# Patient Record
Sex: Male | Born: 1945 | Race: White | Hispanic: No | Marital: Married
Health system: Southern US, Community
[De-identification: ages and names within clinical notes are randomized; demographics above are authoritative.]

---

## 2003-08-01 ENCOUNTER — Other Ambulatory Visit: Payer: Self-pay

## 2003-12-23 ENCOUNTER — Other Ambulatory Visit: Payer: Self-pay

## 2004-01-29 ENCOUNTER — Other Ambulatory Visit: Payer: Self-pay

## 2004-03-27 ENCOUNTER — Other Ambulatory Visit: Payer: Self-pay

## 2004-03-28 ENCOUNTER — Observation Stay: Payer: Self-pay | Admitting: Internal Medicine

## 2004-04-09 ENCOUNTER — Ambulatory Visit: Payer: Self-pay | Admitting: Gastroenterology

## 2004-04-12 ENCOUNTER — Ambulatory Visit: Payer: Self-pay | Admitting: Unknown Physician Specialty

## 2004-06-02 ENCOUNTER — Other Ambulatory Visit: Payer: Self-pay

## 2004-06-02 ENCOUNTER — Emergency Department: Payer: Self-pay | Admitting: Internal Medicine

## 2004-08-25 ENCOUNTER — Other Ambulatory Visit: Payer: Self-pay

## 2004-08-25 ENCOUNTER — Inpatient Hospital Stay: Payer: Self-pay | Admitting: Anesthesiology

## 2004-08-30 ENCOUNTER — Emergency Department: Payer: Self-pay | Admitting: General Practice

## 2009-05-24 ENCOUNTER — Emergency Department: Payer: Self-pay | Admitting: Unknown Physician Specialty

## 2010-01-02 ENCOUNTER — Emergency Department: Payer: Self-pay | Admitting: Emergency Medicine

## 2010-07-30 ENCOUNTER — Emergency Department: Payer: Self-pay | Admitting: Emergency Medicine

## 2011-04-21 ENCOUNTER — Ambulatory Visit: Payer: Self-pay | Admitting: Oncology

## 2011-04-24 ENCOUNTER — Ambulatory Visit: Payer: Self-pay | Admitting: Oncology

## 2011-05-24 ENCOUNTER — Ambulatory Visit: Payer: Self-pay | Admitting: Oncology

## 2011-06-24 ENCOUNTER — Ambulatory Visit: Payer: Self-pay | Admitting: Oncology

## 2011-06-25 ENCOUNTER — Ambulatory Visit: Payer: Self-pay | Admitting: Oncology

## 2011-06-25 LAB — CBC CANCER CENTER
Basophil #: 0.3 x10 3/mm — ABNORMAL HIGH (ref 0.0–0.1)
Basophil %: 2.8 %
Eosinophil #: 0 x10 3/mm (ref 0.0–0.7)
MCH: 33.3 pg (ref 26.0–34.0)
MCHC: 34.5 g/dL (ref 32.0–36.0)
Monocyte #: 1.1 x10 3/mm — ABNORMAL HIGH (ref 0.0–0.7)
Neutrophil %: 65.6 %
RDW: 25.9 % — ABNORMAL HIGH (ref 11.5–14.5)

## 2011-07-10 LAB — COMPREHENSIVE METABOLIC PANEL
Albumin: 2.4 g/dL — ABNORMAL LOW (ref 3.4–5.0)
Anion Gap: 10 (ref 7–16)
BUN: 8 mg/dL (ref 7–18)
Bilirubin,Total: 0.2 mg/dL (ref 0.2–1.0)
Co2: 33 mmol/L — ABNORMAL HIGH (ref 21–32)
EGFR (African American): >60
Glucose: 120 mg/dL — ABNORMAL HIGH (ref 65–99)
Potassium: 2.5 mmol/L — CL (ref 3.5–5.1)
SGOT(AST): 20 U/L (ref 15–37)
Total Protein: 6.5 g/dL (ref 6.4–8.2)

## 2011-07-10 LAB — CBC CANCER CENTER
Basophil %: 0.3 %
Eosinophil #: 0 x10 3/mm (ref 0.0–0.7)
HGB: 8.9 g/dL — ABNORMAL LOW (ref 13.0–18.0)
Lymphocyte #: 1.8 x10 3/mm (ref 1.0–3.6)
Lymphocyte %: 15.4 %
MCH: 34.2 pg — ABNORMAL HIGH (ref 26.0–34.0)
MCHC: 34.1 g/dL (ref 32.0–36.0)
MCV: 100 fL (ref 80–100)
Neutrophil %: 76.8 %
Platelet: 308 x10 3/mm (ref 150–440)
RDW: 28.4 % — ABNORMAL HIGH (ref 11.5–14.5)

## 2011-07-17 LAB — CBC CANCER CENTER
Basophil %: 0.6 %
Eosinophil #: 0.1 x10 3/mm (ref 0.0–0.7)
HCT: 26.3 % — ABNORMAL LOW (ref 40.0–52.0)
Lymphocyte #: 1.2 x10 3/mm (ref 1.0–3.6)
Lymphocyte %: 19 %
MCH: 35.1 pg — ABNORMAL HIGH (ref 26.0–34.0)
MCHC: 34.2 g/dL (ref 32.0–36.0)
MCV: 103 fL — ABNORMAL HIGH (ref 80–100)
Monocyte %: 10.3 %
Neutrophil #: 4.4 x10 3/mm (ref 1.4–6.5)
Neutrophil %: 69.2 %
Platelet: 253 x10 3/mm (ref 150–440)
RBC: 2.56 10*6/uL — ABNORMAL LOW (ref 4.40–5.90)

## 2011-07-17 LAB — BASIC METABOLIC PANEL
Anion Gap: 6 — ABNORMAL LOW (ref 7–16)
BUN: 11 mg/dL (ref 7–18)
Chloride: 94 mmol/L — ABNORMAL LOW (ref 98–107)
Creatinine: 0.77 mg/dL (ref 0.60–1.30)
EGFR (African American): >60
EGFR (Non-African Amer.): >60
Glucose: 96 mg/dL (ref 65–99)
Osmolality: 266 (ref 275–301)
Potassium: 3.1 mmol/L — ABNORMAL LOW (ref 3.5–5.1)

## 2011-07-24 LAB — BASIC METABOLIC PANEL
Anion Gap: 10 (ref 7–16)
Calcium, Total: 7.9 mg/dL — ABNORMAL LOW (ref 8.5–10.1)
Chloride: 97 mmol/L — ABNORMAL LOW (ref 98–107)
Co2: 28 mmol/L (ref 21–32)
Creatinine: 0.8 mg/dL (ref 0.60–1.30)
EGFR (African American): >60
EGFR (Non-African Amer.): >60
Glucose: 82 mg/dL (ref 65–99)
Osmolality: 269 (ref 275–301)

## 2011-07-24 LAB — CBC CANCER CENTER
Basophil #: 0.1 x10 3/mm (ref 0.0–0.1)
Basophil %: 1.2 %
Eosinophil #: 0 x10 3/mm (ref 0.0–0.7)
Eosinophil %: 0.5 %
HCT: 25.9 % — ABNORMAL LOW (ref 40.0–52.0)
HGB: 9 g/dL — ABNORMAL LOW (ref 13.0–18.0)
Lymphocyte #: 0.7 x10 3/mm — ABNORMAL LOW (ref 1.0–3.6)
Lymphocyte %: 16.9 %
MCH: 36.2 pg — ABNORMAL HIGH (ref 26.0–34.0)
MCHC: 34.8 g/dL (ref 32.0–36.0)
MCV: 104 fL — ABNORMAL HIGH (ref 80–100)
Monocyte %: 9.6 %
Neutrophil #: 3.1 x10 3/mm (ref 1.4–6.5)
Neutrophil %: 71.8 %
RBC: 2.49 10*6/uL — ABNORMAL LOW (ref 4.40–5.90)
WBC: 4.3 x10 3/mm (ref 3.8–10.6)

## 2011-07-24 LAB — HEPATIC FUNCTION PANEL A (ARMC)
SGOT(AST): 18 U/L (ref 15–37)
SGPT (ALT): 13 U/L
Total Protein: 6.8 g/dL (ref 6.4–8.2)

## 2011-07-25 ENCOUNTER — Ambulatory Visit: Payer: Self-pay | Admitting: Oncology

## 2011-08-07 LAB — BASIC METABOLIC PANEL
BUN: 8 mg/dL (ref 7–18)
Chloride: 95 mmol/L — ABNORMAL LOW (ref 98–107)
Creatinine: 0.85 mg/dL (ref 0.60–1.30)
EGFR (Non-African Amer.): >60
Glucose: 102 mg/dL — ABNORMAL HIGH (ref 65–99)
Osmolality: 265 (ref 275–301)
Potassium: 3.8 mmol/L (ref 3.5–5.1)
Sodium: 133 mmol/L — ABNORMAL LOW (ref 136–145)

## 2011-08-07 LAB — CBC CANCER CENTER
Basophil #: 0 x10 3/mm (ref 0.0–0.1)
Eosinophil #: 0 x10 3/mm (ref 0.0–0.7)
Eosinophil %: 1 %
Lymphocyte #: 0.4 x10 3/mm — ABNORMAL LOW (ref 1.0–3.6)
MCH: 36.7 pg — ABNORMAL HIGH (ref 26.0–34.0)
MCHC: 34.5 g/dL (ref 32.0–36.0)
MCV: 106 fL — ABNORMAL HIGH (ref 80–100)
Monocyte #: 0.2 x10 3/mm (ref 0.0–0.7)
Neutrophil %: 76.5 %
Platelet: 150 x10 3/mm (ref 150–440)
RBC: 2.75 10*6/uL — ABNORMAL LOW (ref 4.40–5.90)
RDW: 18.8 % — ABNORMAL HIGH (ref 11.5–14.5)

## 2011-08-07 LAB — HEPATIC FUNCTION PANEL A (ARMC)
Albumin: 3.2 g/dL — ABNORMAL LOW (ref 3.4–5.0)
Bilirubin, Direct: 0.2 mg/dL (ref 0.00–0.20)
SGOT(AST): 21 U/L (ref 15–37)
Total Protein: 7.2 g/dL (ref 6.4–8.2)

## 2011-08-21 LAB — COMPREHENSIVE METABOLIC PANEL
Albumin: 2.8 g/dL — ABNORMAL LOW (ref 3.4–5.0)
Alkaline Phosphatase: 117 U/L (ref 50–136)
Anion Gap: 10 (ref 7–16)
Bilirubin,Total: 0.5 mg/dL (ref 0.2–1.0)
Co2: 28 mmol/L (ref 21–32)
Creatinine: 0.74 mg/dL (ref 0.60–1.30)
Glucose: 97 mg/dL (ref 65–99)
Osmolality: 272 (ref 275–301)
Potassium: 3 mmol/L — ABNORMAL LOW (ref 3.5–5.1)
Sodium: 136 mmol/L (ref 136–145)

## 2011-08-21 LAB — CBC CANCER CENTER
Basophil %: 0.8 %
Eosinophil #: 0 x10 3/mm (ref 0.0–0.7)
Eosinophil %: 1.3 %
HCT: 28.4 % — ABNORMAL LOW (ref 40.0–52.0)
HGB: 9.7 g/dL — ABNORMAL LOW (ref 13.0–18.0)
MCHC: 34.1 g/dL (ref 32.0–36.0)
Monocyte #: 0.4 x10 3/mm (ref 0.0–0.7)
Monocyte %: 15.2 %
Neutrophil %: 61.3 %
RDW: 15.6 % — ABNORMAL HIGH (ref 11.5–14.5)
WBC: 2.7 x10 3/mm — ABNORMAL LOW (ref 3.8–10.6)

## 2011-08-22 ENCOUNTER — Ambulatory Visit: Payer: Self-pay | Admitting: Oncology

## 2011-09-03 LAB — COMPREHENSIVE METABOLIC PANEL
Anion Gap: 6 — ABNORMAL LOW (ref 7–16)
Bilirubin,Total: 0.3 mg/dL (ref 0.2–1.0)
Calcium, Total: 8.5 mg/dL (ref 8.5–10.1)
Chloride: 99 mmol/L (ref 98–107)
Co2: 32 mmol/L (ref 21–32)
EGFR (African American): >60
EGFR (Non-African Amer.): >60
Glucose: 92 mg/dL (ref 65–99)
Osmolality: 275 (ref 275–301)
SGPT (ALT): 14 U/L
Sodium: 137 mmol/L (ref 136–145)

## 2011-09-03 LAB — CBC CANCER CENTER
Eosinophil #: 0.1 x10 3/mm (ref 0.0–0.7)
HGB: 11.1 g/dL — ABNORMAL LOW (ref 13.0–18.0)
MCH: 35.5 pg — ABNORMAL HIGH (ref 26.0–34.0)
MCHC: 33.8 g/dL (ref 32.0–36.0)
Monocyte #: 0.5 x10 3/mm (ref 0.0–0.7)
Neutrophil %: 63.2 %
Platelet: 277 x10 3/mm (ref 150–440)

## 2011-09-10 LAB — CBC CANCER CENTER
Basophil %: 1 %
Eosinophil %: 1.3 %
HCT: 36 % — ABNORMAL LOW (ref 40.0–52.0)
HGB: 12.6 g/dL — ABNORMAL LOW (ref 13.0–18.0)
Lymphocyte #: 1 x10 3/mm (ref 1.0–3.6)
MCH: 36.3 pg — ABNORMAL HIGH (ref 26.0–34.0)
MCV: 104 fL — ABNORMAL HIGH (ref 80–100)
Monocyte #: 0.5 x10 3/mm (ref 0.0–0.7)
Monocyte %: 12.3 %
Neutrophil #: 2.5 x10 3/mm (ref 1.4–6.5)
Platelet: 343 x10 3/mm (ref 150–440)
RBC: 3.46 10*6/uL — ABNORMAL LOW (ref 4.40–5.90)
WBC: 4.1 x10 3/mm (ref 3.8–10.6)

## 2011-09-10 LAB — COMPREHENSIVE METABOLIC PANEL
Albumin: 3.4 g/dL (ref 3.4–5.0)
Alkaline Phosphatase: 114 U/L (ref 50–136)
BUN: 11 mg/dL (ref 7–18)
Bilirubin,Total: 0.3 mg/dL (ref 0.2–1.0)
Calcium, Total: 8.9 mg/dL (ref 8.5–10.1)
Co2: 30 mmol/L (ref 21–32)
Creatinine: 0.71 mg/dL (ref 0.60–1.30)
EGFR (Non-African Amer.): >60
Glucose: 103 mg/dL — ABNORMAL HIGH (ref 65–99)
Osmolality: 272 (ref 275–301)
Potassium: 3.7 mmol/L (ref 3.5–5.1)
SGOT(AST): 21 U/L (ref 15–37)
Sodium: 136 mmol/L (ref 136–145)
Total Protein: 7.4 g/dL (ref 6.4–8.2)

## 2011-09-17 LAB — COMPREHENSIVE METABOLIC PANEL
Alkaline Phosphatase: 106 U/L (ref 50–136)
BUN: 24 mg/dL — ABNORMAL HIGH (ref 7–18)
Chloride: 93 mmol/L — ABNORMAL LOW (ref 98–107)
Co2: 28 mmol/L (ref 21–32)
Creatinine: 0.94 mg/dL (ref 0.60–1.30)
EGFR (Non-African Amer.): >60
Glucose: 98 mg/dL (ref 65–99)
SGOT(AST): 19 U/L (ref 15–37)
SGPT (ALT): 16 U/L
Sodium: 130 mmol/L — ABNORMAL LOW (ref 136–145)

## 2011-09-17 LAB — CBC CANCER CENTER
Basophil #: 0 x10 3/mm (ref 0.0–0.1)
Basophil %: 0.5 %
Eosinophil #: 0 x10 3/mm (ref 0.0–0.7)
Lymphocyte #: 0.7 x10 3/mm — ABNORMAL LOW (ref 1.0–3.6)
Lymphocyte %: 9.2 %
MCV: 103 fL — ABNORMAL HIGH (ref 80–100)
Monocyte #: 0.7 x10 3/mm (ref 0.0–0.7)
Monocyte %: 10.2 %
Neutrophil #: 5.7 x10 3/mm (ref 1.4–6.5)
Neutrophil %: 80 %
RBC: 3.55 10*6/uL — ABNORMAL LOW (ref 4.40–5.90)
WBC: 7.1 x10 3/mm (ref 3.8–10.6)

## 2011-09-22 ENCOUNTER — Ambulatory Visit: Payer: Self-pay | Admitting: Oncology

## 2011-09-24 ENCOUNTER — Inpatient Hospital Stay: Payer: Self-pay | Admitting: Oncology

## 2011-09-24 LAB — BASIC METABOLIC PANEL
Anion Gap: 13 (ref 7–16)
BUN: 29 mg/dL — ABNORMAL HIGH (ref 7–18)
Calcium, Total: 9.1 mg/dL (ref 8.5–10.1)
Co2: 30 mmol/L (ref 21–32)
Creatinine: 0.96 mg/dL (ref 0.60–1.30)
EGFR (African American): >60

## 2011-09-24 LAB — URINALYSIS, COMPLETE
Blood: NEGATIVE
Hyaline Cast: 100
Leukocyte Esterase: NEGATIVE
Nitrite: NEGATIVE
Ph: 5 (ref 4.5–8.0)
Protein: 30
Specific Gravity: 1.029 (ref 1.003–1.030)
Squamous Epithelial: 1

## 2011-09-24 LAB — CBC CANCER CENTER
Basophil #: 0 x10 3/mm (ref 0.0–0.1)
Basophil %: 0.8 %
Eosinophil #: 0 x10 3/mm (ref 0.0–0.7)
Eosinophil %: 0.3 %
HGB: 12.9 g/dL — ABNORMAL LOW (ref 13.0–18.0)
Lymphocyte #: 0.4 x10 3/mm — ABNORMAL LOW (ref 1.0–3.6)
Lymphocyte %: 9.1 %
MCV: 101 fL — ABNORMAL HIGH (ref 80–100)
Monocyte #: 0.4 x10 3/mm (ref 0.0–0.7)
Neutrophil #: 3.5 x10 3/mm (ref 1.4–6.5)
Platelet: 207 x10 3/mm (ref 150–440)
RBC: 3.7 10*6/uL — ABNORMAL LOW (ref 4.40–5.90)
RDW: 15.2 % — ABNORMAL HIGH (ref 11.5–14.5)
WBC: 4.3 x10 3/mm (ref 3.8–10.6)

## 2011-09-25 LAB — CBC WITH DIFFERENTIAL/PLATELET
Eosinophil #: 0 10*3/uL (ref 0.0–0.7)
HCT: 30.8 % — ABNORMAL LOW (ref 40.0–52.0)
HGB: 10.4 g/dL — ABNORMAL LOW (ref 13.0–18.0)
Lymphocyte %: 15.8 %
MCHC: 33.6 g/dL (ref 32.0–36.0)
Monocyte %: 10.6 %
Neutrophil #: 2.2 10*3/uL (ref 1.4–6.5)
Neutrophil %: 73 %

## 2011-09-25 LAB — COMPREHENSIVE METABOLIC PANEL
Albumin: 2.7 g/dL — ABNORMAL LOW (ref 3.4–5.0)
BUN: 20 mg/dL — ABNORMAL HIGH (ref 7–18)
Bilirubin,Total: 0.2 mg/dL (ref 0.2–1.0)
Calcium, Total: 8.3 mg/dL — ABNORMAL LOW (ref 8.5–10.1)
Chloride: 98 mmol/L (ref 98–107)
Co2: 28 mmol/L (ref 21–32)
EGFR (African American): >60
EGFR (Non-African Amer.): >60
Glucose: 87 mg/dL (ref 65–99)
Osmolality: 272 (ref 275–301)
SGOT(AST): 33 U/L (ref 15–37)
Sodium: 135 mmol/L — ABNORMAL LOW (ref 136–145)

## 2011-09-26 LAB — CBC WITH DIFFERENTIAL/PLATELET
Eosinophil #: 0 10*3/uL (ref 0.0–0.7)
Eosinophil %: 0.1 %
HCT: 25.8 % — ABNORMAL LOW (ref 40.0–52.0)
HGB: 8.6 g/dL — ABNORMAL LOW (ref 13.0–18.0)
Lymphocyte #: 0.5 10*3/uL — ABNORMAL LOW (ref 1.0–3.6)
Lymphocyte %: 20.3 %
MCHC: 33.2 g/dL (ref 32.0–36.0)
MCV: 102 fL — ABNORMAL HIGH (ref 80–100)
Monocyte %: 16.1 %
Neutrophil #: 1.5 10*3/uL (ref 1.4–6.5)
Neutrophil %: 63 %
RDW: 14.7 % — ABNORMAL HIGH (ref 11.5–14.5)

## 2011-09-26 LAB — BASIC METABOLIC PANEL
Anion Gap: 12 (ref 7–16)
BUN: 8 mg/dL (ref 7–18)
Calcium, Total: 7.6 mg/dL — ABNORMAL LOW (ref 8.5–10.1)
Chloride: 105 mmol/L (ref 98–107)
EGFR (African American): >60
EGFR (Non-African Amer.): >60
Osmolality: 277 (ref 275–301)
Potassium: 3.1 mmol/L — ABNORMAL LOW (ref 3.5–5.1)
Sodium: 140 mmol/L (ref 136–145)

## 2011-09-26 LAB — URINE CULTURE

## 2011-09-29 ENCOUNTER — Inpatient Hospital Stay: Payer: Self-pay | Admitting: Oncology

## 2011-09-30 LAB — CULTURE, BLOOD (SINGLE)

## 2011-10-02 LAB — BASIC METABOLIC PANEL
Anion Gap: 11 (ref 7–16)
BUN: 7 mg/dL (ref 7–18)
Chloride: 100 mmol/L (ref 98–107)
Creatinine: 0.59 mg/dL — ABNORMAL LOW (ref 0.60–1.30)
EGFR (African American): >60
EGFR (Non-African Amer.): >60
Glucose: 69 mg/dL (ref 65–99)
Osmolality: 268 (ref 275–301)
Sodium: 136 mmol/L (ref 136–145)

## 2011-10-02 LAB — CBC WITH DIFFERENTIAL/PLATELET
Basophil %: 1.5 %
Eosinophil %: 0.4 %
HGB: 10.5 g/dL — ABNORMAL LOW (ref 13.0–18.0)
MCH: 34 pg (ref 26.0–34.0)
MCHC: 33.9 g/dL (ref 32.0–36.0)
Monocyte #: 0.3 x10 3/mm (ref 0.2–1.0)
Monocyte %: 12.1 %
Neutrophil %: 69 %
Platelet: 165 10*3/uL (ref 150–440)
RBC: 3.08 10*6/uL — ABNORMAL LOW (ref 4.40–5.90)
WBC: 2.8 10*3/uL — ABNORMAL LOW (ref 3.8–10.6)

## 2011-10-22 ENCOUNTER — Ambulatory Visit: Payer: Self-pay | Admitting: Oncology

## 2011-11-03 ENCOUNTER — Ambulatory Visit: Payer: Self-pay | Admitting: Oncology

## 2011-11-03 LAB — COMPREHENSIVE METABOLIC PANEL
Albumin: 3.3 g/dL — ABNORMAL LOW (ref 3.4–5.0)
Alkaline Phosphatase: 92 U/L (ref 50–136)
Anion Gap: 10 (ref 7–16)
Bilirubin,Total: 0.5 mg/dL (ref 0.2–1.0)
Creatinine: 0.76 mg/dL (ref 0.60–1.30)
EGFR (Non-African Amer.): >60
Glucose: 114 mg/dL — ABNORMAL HIGH (ref 65–99)
Osmolality: 269 (ref 275–301)
SGOT(AST): 14 U/L — ABNORMAL LOW (ref 15–37)
Sodium: 134 mmol/L — ABNORMAL LOW (ref 136–145)
Total Protein: 6.7 g/dL (ref 6.4–8.2)

## 2011-11-03 LAB — CBC CANCER CENTER
Basophil #: 0 x10 3/mm (ref 0.0–0.1)
Eosinophil #: 0 x10 3/mm (ref 0.0–0.7)
Eosinophil %: 1 %
HCT: 39.1 % — ABNORMAL LOW (ref 40.0–52.0)
Lymphocyte #: 0.6 x10 3/mm — ABNORMAL LOW (ref 1.0–3.6)
Lymphocyte %: 13.5 %
MCH: 32 pg (ref 26.0–34.0)
MCV: 97 fL (ref 80–100)
Monocyte #: 0.5 x10 3/mm (ref 0.2–1.0)
Platelet: 227 x10 3/mm (ref 150–440)
RDW: 14.4 % (ref 11.5–14.5)
WBC: 4.5 x10 3/mm (ref 3.8–10.6)

## 2011-11-22 ENCOUNTER — Ambulatory Visit: Payer: Self-pay | Admitting: Oncology

## 2011-11-24 LAB — CBC CANCER CENTER
Eosinophil #: 0 x10 3/mm (ref 0.0–0.7)
Eosinophil %: 0 %
Lymphocyte #: 0.2 x10 3/mm — ABNORMAL LOW (ref 1.0–3.6)
Lymphocyte %: 2.2 %
MCHC: 32.6 g/dL (ref 32.0–36.0)
MCV: 94 fL (ref 80–100)
Monocyte #: 0.2 x10 3/mm (ref 0.2–1.0)
Monocyte %: 2.8 %
Neutrophil #: 8.5 x10 3/mm — ABNORMAL HIGH (ref 1.4–6.5)
Neutrophil %: 94.8 %
Platelet: 258 x10 3/mm (ref 150–440)
RBC: 3.53 10*6/uL — ABNORMAL LOW (ref 4.40–5.90)
RDW: 14.7 % — ABNORMAL HIGH (ref 11.5–14.5)
WBC: 9 x10 3/mm (ref 3.8–10.6)

## 2011-11-24 LAB — COMPREHENSIVE METABOLIC PANEL
Albumin: 2.5 g/dL — ABNORMAL LOW (ref 3.4–5.0)
Alkaline Phosphatase: 85 U/L (ref 50–136)
Bilirubin,Total: 0.5 mg/dL (ref 0.2–1.0)
Calcium, Total: 8.4 mg/dL — ABNORMAL LOW (ref 8.5–10.1)
Chloride: 99 mmol/L (ref 98–107)
Co2: 30 mmol/L (ref 21–32)
EGFR (African American): >60
EGFR (Non-African Amer.): >60
Glucose: 138 mg/dL — ABNORMAL HIGH (ref 65–99)
Potassium: 3.7 mmol/L (ref 3.5–5.1)
SGPT (ALT): 17 U/L
Sodium: 135 mmol/L — ABNORMAL LOW (ref 136–145)

## 2011-12-22 ENCOUNTER — Ambulatory Visit: Payer: Self-pay | Admitting: Oncology

## 2011-12-31 LAB — COMPREHENSIVE METABOLIC PANEL
Albumin: 2.9 g/dL — ABNORMAL LOW (ref 3.4–5.0)
Alkaline Phosphatase: 75 U/L (ref 50–136)
Anion Gap: 8 (ref 7–16)
BUN: 16 mg/dL (ref 7–18)
Bilirubin,Total: 0.5 mg/dL (ref 0.2–1.0)
Calcium, Total: 8.6 mg/dL (ref 8.5–10.1)
Chloride: 104 mmol/L (ref 98–107)
Co2: 27 mmol/L (ref 21–32)
Creatinine: 0.79 mg/dL (ref 0.60–1.30)
EGFR (African American): >60
EGFR (Non-African Amer.): >60
Glucose: 172 mg/dL — ABNORMAL HIGH (ref 65–99)
Osmolality: 283 (ref 275–301)
Potassium: 3.4 mmol/L — ABNORMAL LOW (ref 3.5–5.1)
SGOT(AST): 12 U/L — ABNORMAL LOW (ref 15–37)
SGPT (ALT): 15 U/L
Sodium: 139 mmol/L (ref 136–145)
Total Protein: 6.2 g/dL — ABNORMAL LOW (ref 6.4–8.2)

## 2011-12-31 LAB — CBC CANCER CENTER
Basophil %: 0.1 %
Eosinophil #: 0 x10 3/mm (ref 0.0–0.7)
HGB: 11.5 g/dL — ABNORMAL LOW (ref 13.0–18.0)
Lymphocyte #: 0.4 x10 3/mm — ABNORMAL LOW (ref 1.0–3.6)
Lymphocyte %: 3.3 %
MCH: 29.8 pg (ref 26.0–34.0)
MCHC: 31.9 g/dL — ABNORMAL LOW (ref 32.0–36.0)
MCV: 94 fL (ref 80–100)
Monocyte #: 0.4 x10 3/mm (ref 0.2–1.0)
Neutrophil %: 92.7 %
Platelet: 162 x10 3/mm (ref 150–440)
RDW: 16.2 % — ABNORMAL HIGH (ref 11.5–14.5)

## 2012-01-22 ENCOUNTER — Ambulatory Visit: Payer: Self-pay | Admitting: Oncology

## 2012-01-28 LAB — CBC CANCER CENTER
Basophil #: 0.1 x10 3/mm (ref 0.0–0.1)
Basophil %: 0.9 %
Eosinophil #: 0 x10 3/mm (ref 0.0–0.7)
Eosinophil %: 0.7 %
HCT: 34.6 % — ABNORMAL LOW (ref 40.0–52.0)
HGB: 11.6 g/dL — ABNORMAL LOW (ref 13.0–18.0)
Lymphocyte #: 1.3 x10 3/mm (ref 1.0–3.6)
Lymphocyte %: 22.3 %
MCHC: 33.4 g/dL (ref 32.0–36.0)
MCV: 92 fL (ref 80–100)
Monocyte #: 0.5 x10 3/mm (ref 0.2–1.0)
Monocyte %: 8.4 %
Neutrophil #: 3.9 x10 3/mm (ref 1.4–6.5)
WBC: 5.8 x10 3/mm (ref 3.8–10.6)

## 2012-01-28 LAB — COMPREHENSIVE METABOLIC PANEL
Albumin: 3 g/dL — ABNORMAL LOW (ref 3.4–5.0)
Alkaline Phosphatase: 81 U/L (ref 50–136)
Anion Gap: 9 (ref 7–16)
BUN: 18 mg/dL (ref 7–18)
Calcium, Total: 8.2 mg/dL — ABNORMAL LOW (ref 8.5–10.1)
Co2: 28 mmol/L (ref 21–32)
Creatinine: 0.81 mg/dL (ref 0.60–1.30)
Glucose: 98 mg/dL (ref 65–99)
SGOT(AST): 12 U/L — ABNORMAL LOW (ref 15–37)

## 2012-02-22 ENCOUNTER — Ambulatory Visit: Payer: Self-pay | Admitting: Oncology

## 2012-02-29 ENCOUNTER — Emergency Department: Payer: Self-pay | Admitting: *Deleted

## 2012-03-01 LAB — CBC
HCT: 33.9 % — ABNORMAL LOW (ref 40.0–52.0)
Platelet: 246 10*3/uL (ref 150–440)
RDW: 15.5 % — ABNORMAL HIGH (ref 11.5–14.5)

## 2012-03-01 LAB — COMPREHENSIVE METABOLIC PANEL
Albumin: 3 g/dL — ABNORMAL LOW (ref 3.4–5.0)
Alkaline Phosphatase: 93 U/L (ref 50–136)
Bilirubin,Total: 0.2 mg/dL (ref 0.2–1.0)
Chloride: 107 mmol/L (ref 98–107)
Co2: 22 mmol/L (ref 21–32)
Creatinine: 0.73 mg/dL (ref 0.60–1.30)
EGFR (Non-African Amer.): >60
Glucose: 135 mg/dL — ABNORMAL HIGH (ref 65–99)
Osmolality: 284 (ref 275–301)
Sodium: 140 mmol/L (ref 136–145)
Total Protein: 6.2 g/dL — ABNORMAL LOW (ref 6.4–8.2)

## 2012-03-01 LAB — TROPONIN I: Troponin-I: <0.02 ng/mL

## 2012-03-01 LAB — CK TOTAL AND CKMB (NOT AT ARMC)
CK, Total: 35 U/L (ref 35–232)
CK, Total: 38 U/L (ref 35–232)
CK-MB: 1.2 ng/mL (ref 0.5–3.6)
CK-MB: 1.2 ng/mL (ref 0.5–3.6)

## 2012-04-28 ENCOUNTER — Inpatient Hospital Stay: Payer: Self-pay | Admitting: Internal Medicine

## 2012-04-28 LAB — URINALYSIS, COMPLETE
Bacteria: NONE SEEN
Bilirubin,UR: NEGATIVE
Glucose,UR: NEGATIVE mg/dL (ref 0–75)
Hyaline Cast: 3
Leukocyte Esterase: NEGATIVE
Nitrite: NEGATIVE
RBC,UR: <1 /HPF (ref 0–5)
Specific Gravity: 1.012 (ref 1.003–1.030)
WBC UR: 2 /HPF (ref 0–5)

## 2012-04-28 LAB — BASIC METABOLIC PANEL
BUN: 24 mg/dL — ABNORMAL HIGH (ref 7–18)
Creatinine: 1.4 mg/dL — ABNORMAL HIGH (ref 0.60–1.30)
EGFR (Non-African Amer.): 51 — ABNORMAL LOW
Glucose: 84 mg/dL (ref 65–99)
Osmolality: 279 (ref 275–301)
Potassium: 4.5 mmol/L (ref 3.5–5.1)
Sodium: 138 mmol/L (ref 136–145)

## 2012-04-28 LAB — CBC
MCH: 28.8 pg (ref 26.0–34.0)
MCHC: 33.8 g/dL (ref 32.0–36.0)
Platelet: 233 10*3/uL (ref 150–440)
RBC: 4.48 10*6/uL (ref 4.40–5.90)
WBC: 17.7 10*3/uL — ABNORMAL HIGH (ref 3.8–10.6)

## 2012-04-29 ENCOUNTER — Ambulatory Visit: Payer: Self-pay | Admitting: Internal Medicine

## 2012-04-29 LAB — BASIC METABOLIC PANEL
Anion Gap: 11 (ref 7–16)
BUN: 25 mg/dL — ABNORMAL HIGH (ref 7–18)
Chloride: 104 mmol/L (ref 98–107)
EGFR (Non-African Amer.): >60
Glucose: 73 mg/dL (ref 65–99)
Osmolality: 279 (ref 275–301)
Sodium: 138 mmol/L (ref 136–145)

## 2012-04-29 LAB — CBC WITH DIFFERENTIAL/PLATELET
Basophil #: 0 10*3/uL (ref 0.0–0.1)
Eosinophil #: 0 10*3/uL (ref 0.0–0.7)
Lymphocyte #: 0.8 10*3/uL — ABNORMAL LOW (ref 1.0–3.6)
Lymphocyte %: 5.1 %
MCH: 28.3 pg (ref 26.0–34.0)
MCV: 85 fL (ref 80–100)
Monocyte %: 4.4 %
Platelet: 201 10*3/uL (ref 150–440)
RDW: 15.7 % — ABNORMAL HIGH (ref 11.5–14.5)
WBC: 16.1 10*3/uL — ABNORMAL HIGH (ref 3.8–10.6)

## 2012-04-30 LAB — CBC WITH DIFFERENTIAL/PLATELET
Basophil #: 0.1 10*3/uL (ref 0.0–0.1)
Basophil %: 0.6 %
Eosinophil #: 0 10*3/uL (ref 0.0–0.7)
HCT: 32.6 % — ABNORMAL LOW (ref 40.0–52.0)
HGB: 11 g/dL — ABNORMAL LOW (ref 13.0–18.0)
MCH: 28.1 pg (ref 26.0–34.0)
MCHC: 33.6 g/dL (ref 32.0–36.0)
Monocyte #: 0.6 x10 3/mm (ref 0.2–1.0)
Neutrophil %: 85.8 %
Platelet: 167 10*3/uL (ref 150–440)
RBC: 3.91 10*6/uL — ABNORMAL LOW (ref 4.40–5.90)
RDW: 15.3 % — ABNORMAL HIGH (ref 11.5–14.5)
WBC: 10 10*3/uL (ref 3.8–10.6)

## 2012-04-30 LAB — BASIC METABOLIC PANEL
BUN: 23 mg/dL — ABNORMAL HIGH (ref 7–18)
Calcium, Total: 8.2 mg/dL — ABNORMAL LOW (ref 8.5–10.1)
Chloride: 104 mmol/L (ref 98–107)
Co2: 24 mmol/L (ref 21–32)
Creatinine: 0.95 mg/dL (ref 0.60–1.30)
EGFR (African American): >60
EGFR (Non-African Amer.): >60
Glucose: 66 mg/dL (ref 65–99)
Osmolality: 279 (ref 275–301)
Potassium: 3.7 mmol/L (ref 3.5–5.1)
Sodium: 139 mmol/L (ref 136–145)

## 2012-04-30 LAB — URINE CULTURE

## 2012-05-02 LAB — CULTURE, BLOOD (SINGLE)

## 2012-05-03 LAB — CULTURE, BLOOD (SINGLE)

## 2012-05-19 IMAGING — CT CT SIM MISC
1 series · 16 of 32 positions shown, 20 images · non-contrast
Comparison: none

[Series 2: — · axial · 1.17mm/px · z∈[-950,-584]mm · 16 of 129 slices shown, 20 images]
[im 5/129  mediastinal]
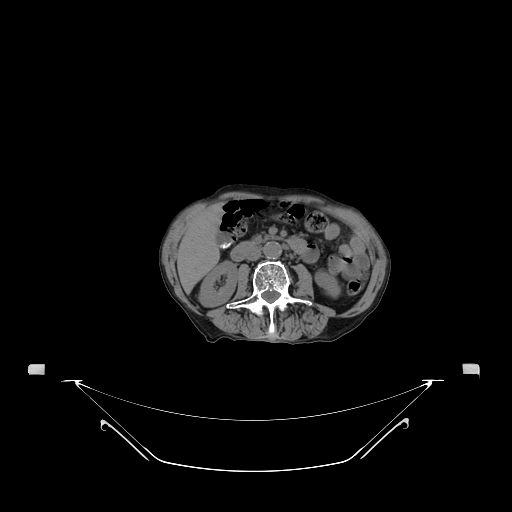
[im 5/129  lung]
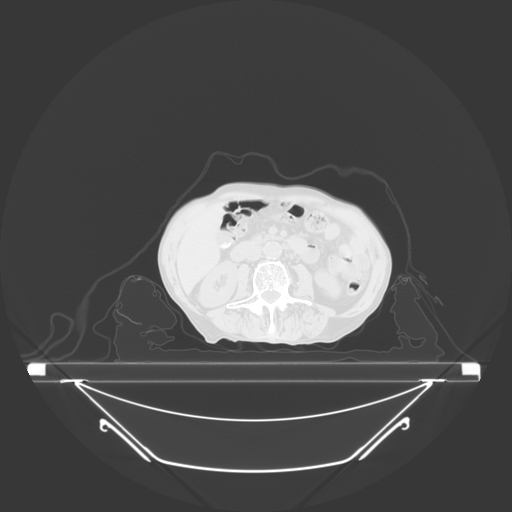
[im 15/129  lung]
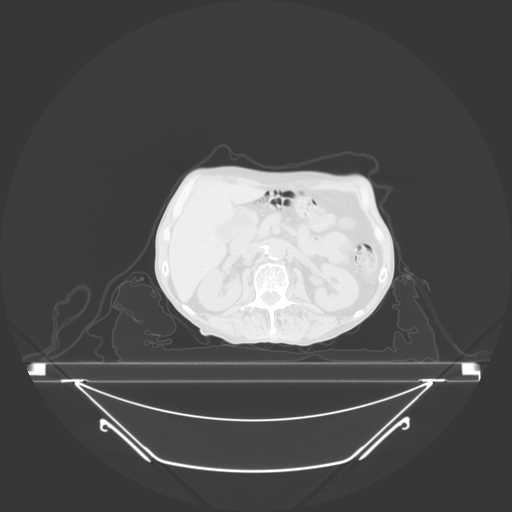
[im 24/129  lung]
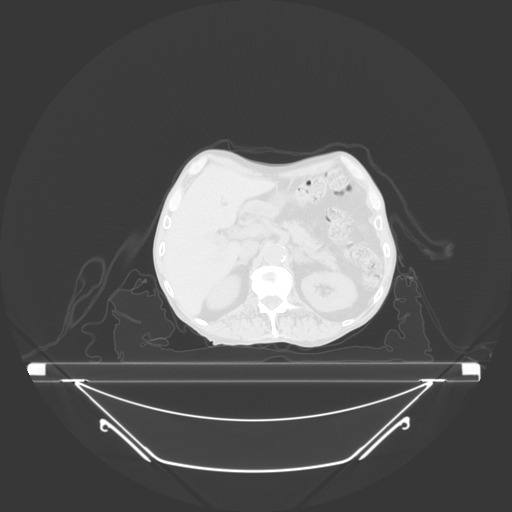
[im 29/129  lung]
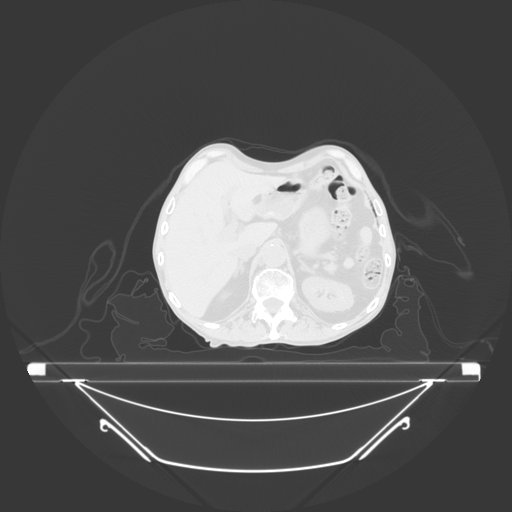
[im 38/129  mediastinal]
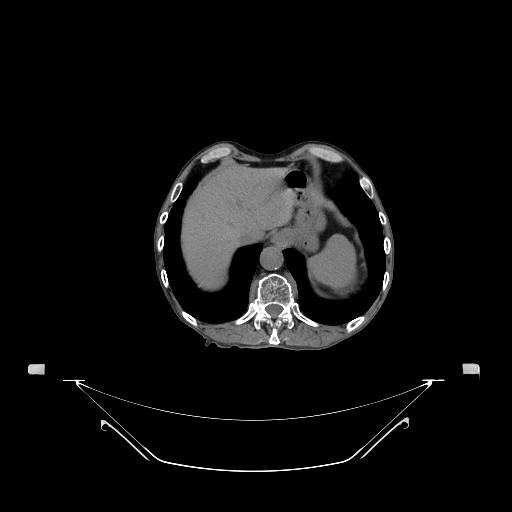
[im 38/129  lung]
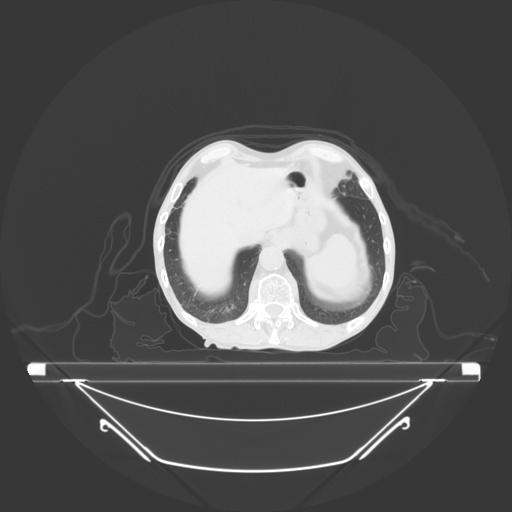
[im 48/129  lung]
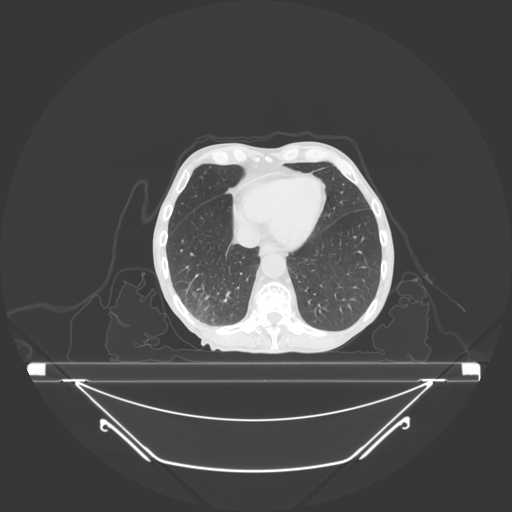
[im 53/129  lung]
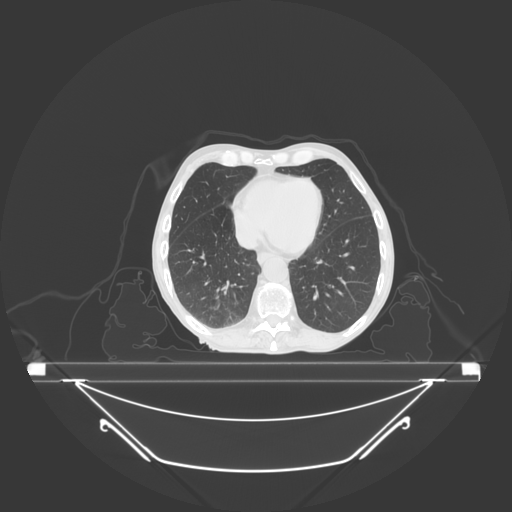
[im 62/129  lung]
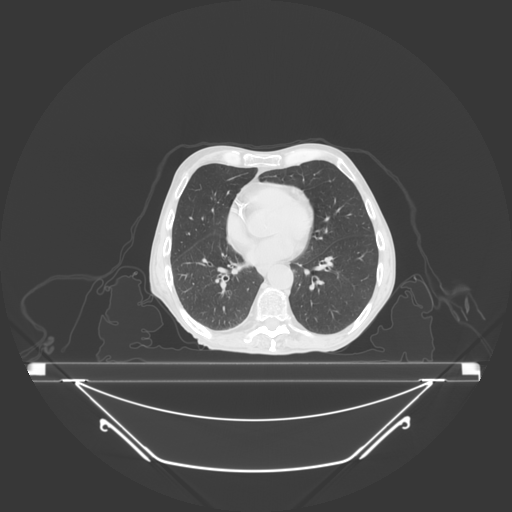
[im 67/129  mediastinal]
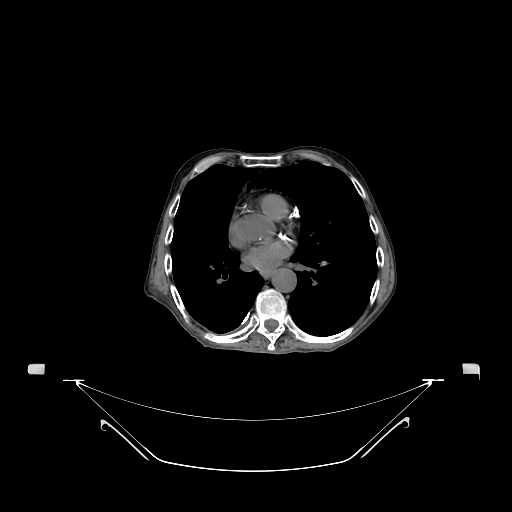
[im 67/129  lung]
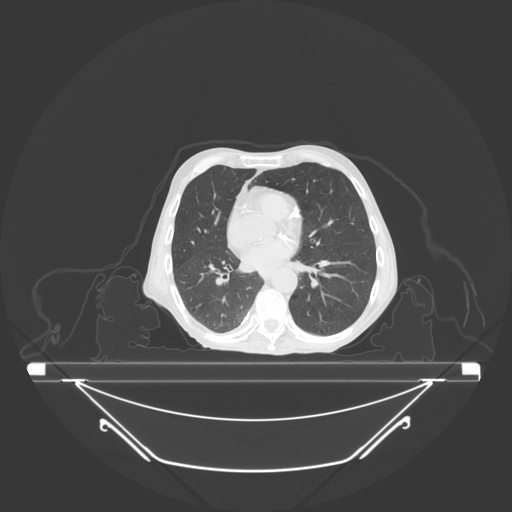
[im 76/129  lung]
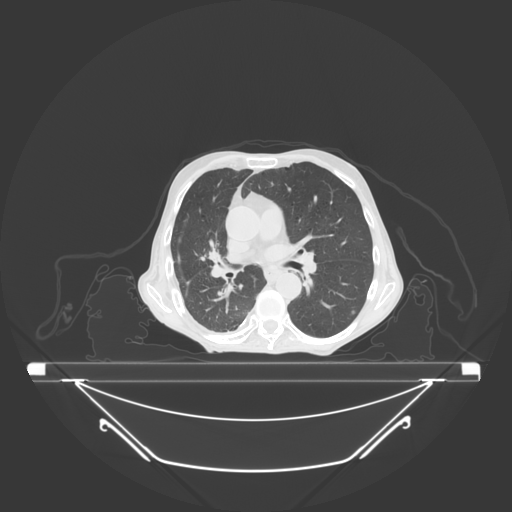
[im 81/129  lung]
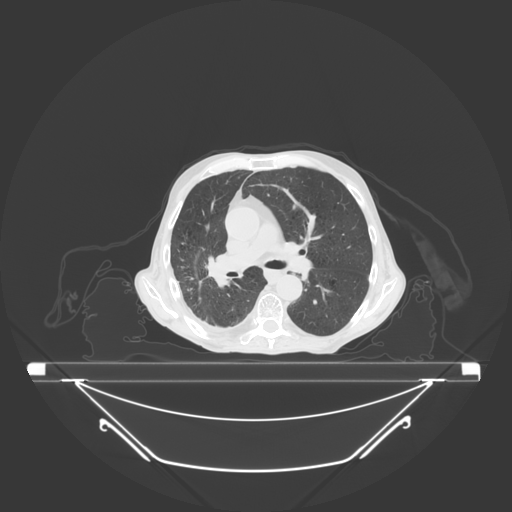
[im 91/129  lung]
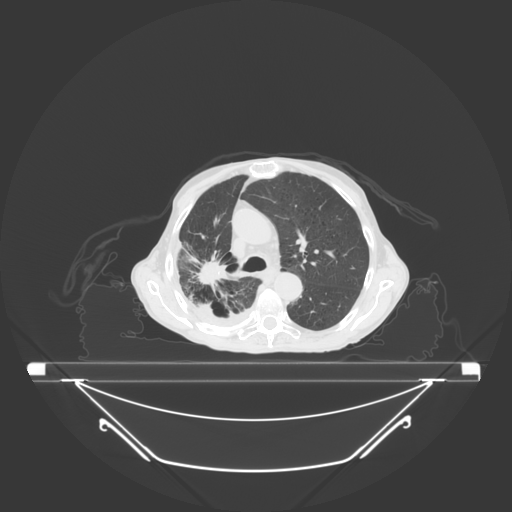
[im 100/129  mediastinal]
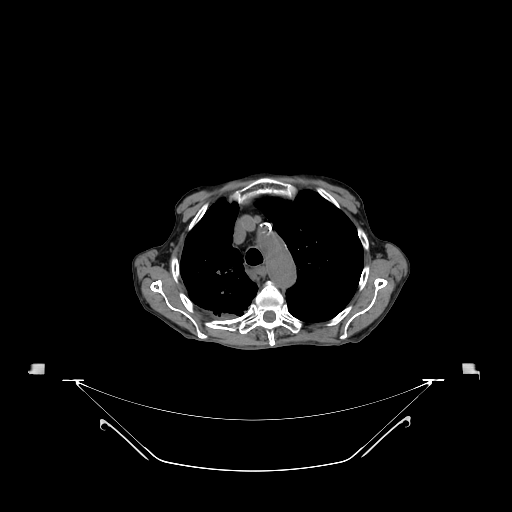
[im 100/129  lung]
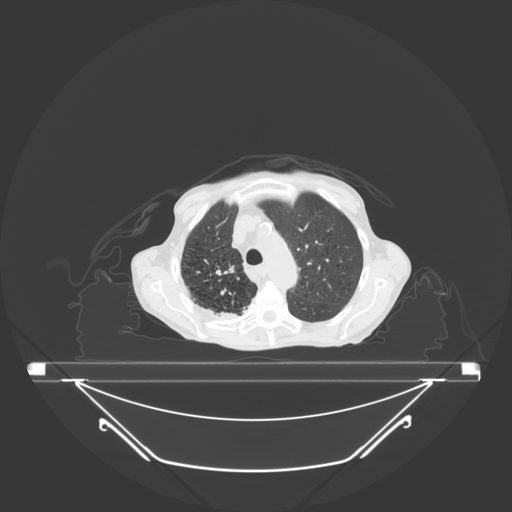
[im 105/129  lung]
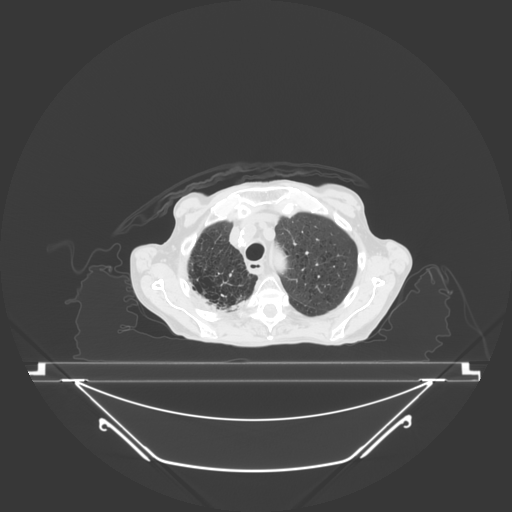
[im 114/129  lung]
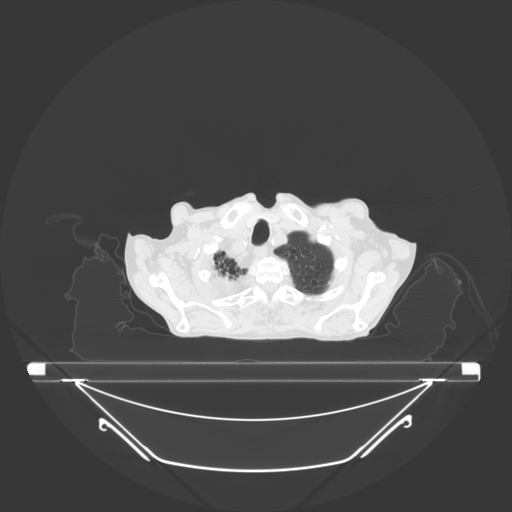
[im 124/129  lung]
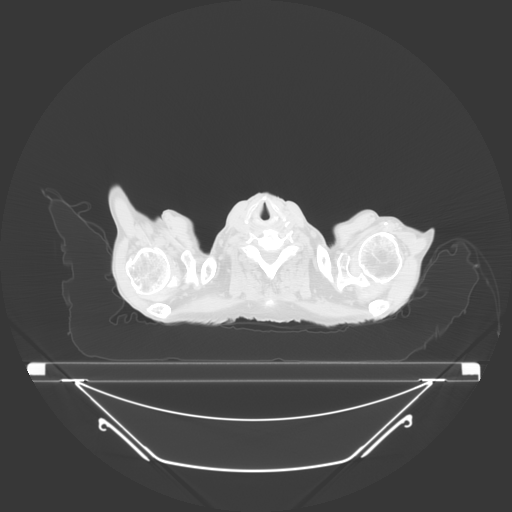

[16 of 32 positions shown; findings below may reference images not displayed]

IMAGES IMPORTED FROM THE [employer] WORKFLOW SYSTEM
NO DICTATION FOR STUDY

## 2012-05-23 ENCOUNTER — Ambulatory Visit: Payer: Self-pay | Admitting: Internal Medicine

## 2012-11-16 IMAGING — CR DG CHEST 1V PORT
1 series · 2 of 2 positions shown · non-contrast
Comparison: none

REASON FOR EXAM: Shortness of Breath
COMMENTS:

[Series 1: portable · 0.17mm/px · 2 of 2 slices shown]
[im 1/2]
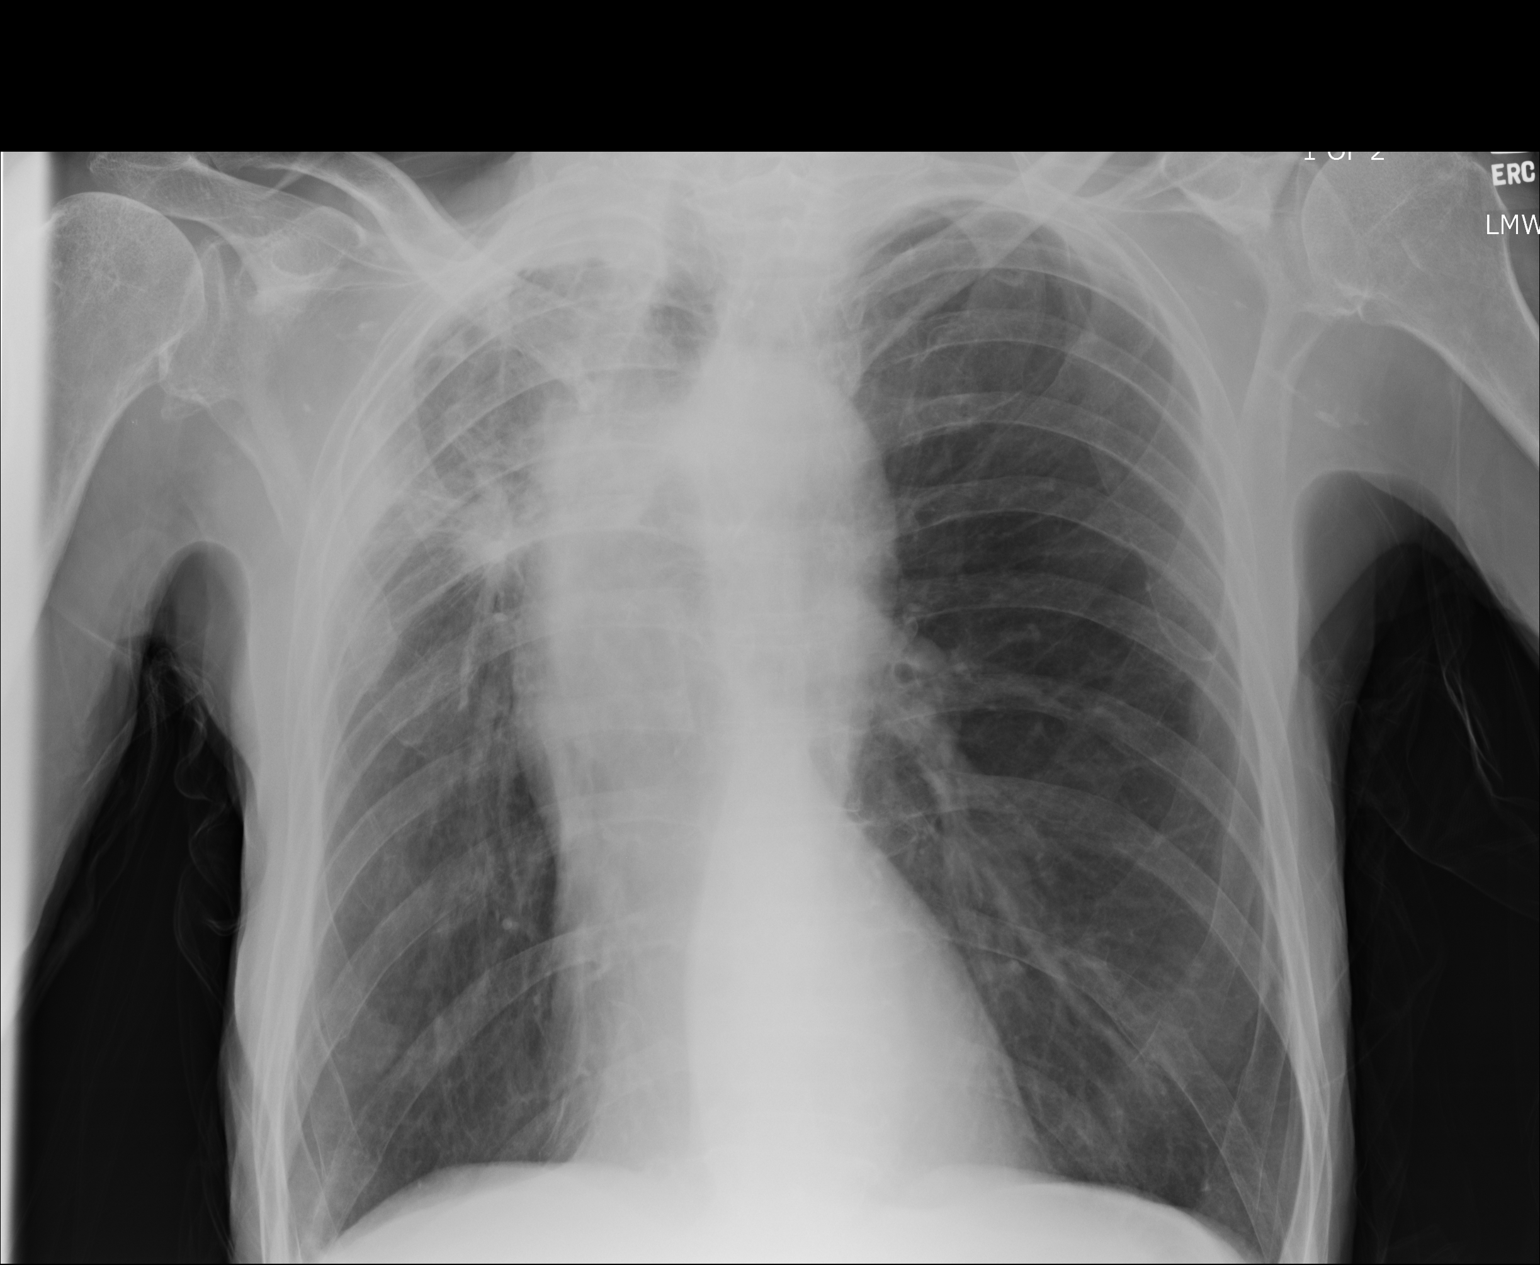
[im 2/2]
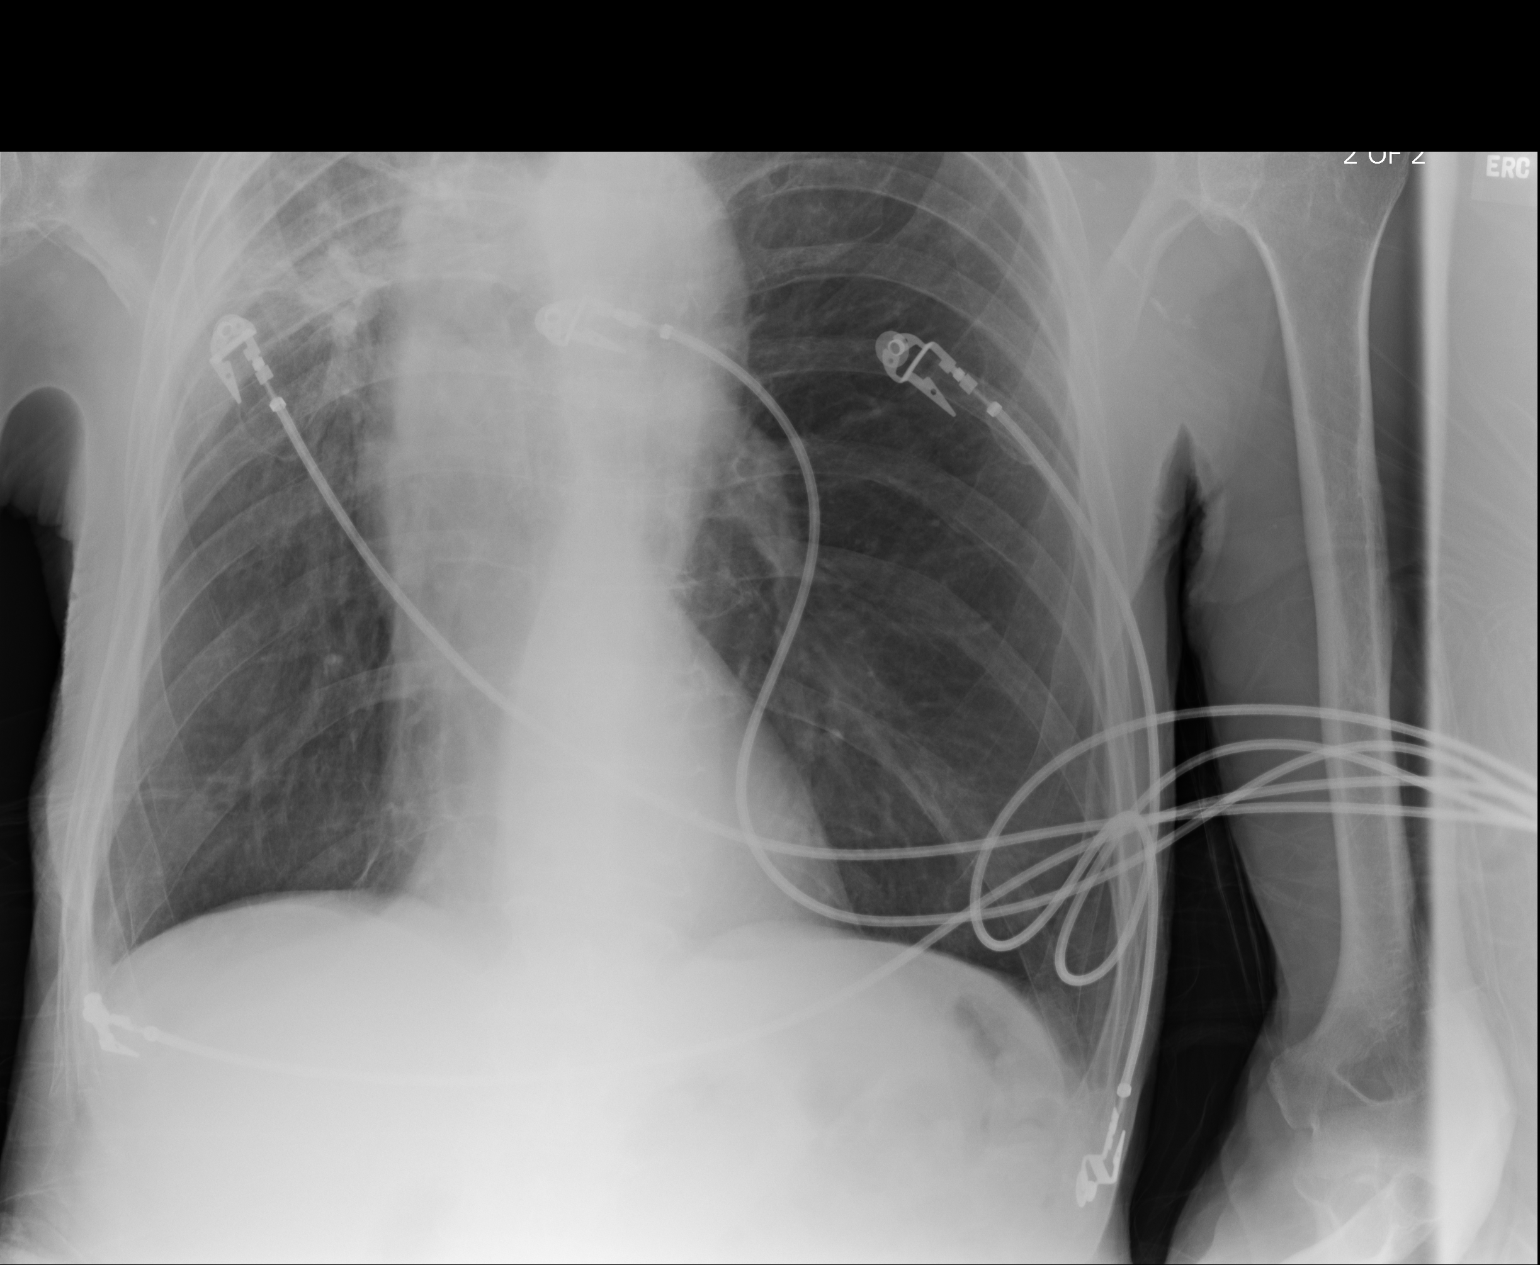

[2 of 2 positions shown; findings below may reference images not displayed]

PROCEDURE:     DXR - DXR PORTABLE CHEST SINGLE VIEW  - March 01, 2012 [DATE]

RESULT:     Comparison is made to the study May 24, 2009.

There are chronic changes in the right upper hemithorax. There is shift of
the mediastinum from left to right. The aerated portion of the right lung is
well-expanded. An area of nodularity projects in the superiorly retracted
right hilum. The left lung is clear.
IMPRESSION: There is volume loss on the right centered in the upper
hemithorax. This is an area of previous abnormality. There is compensatory
hyperinflation of the left lung. Followup chest CT scanning is recommended
to exclude an underlying mass or other central obstructing process.

[REDACTED]

## 2012-11-22 ENCOUNTER — Ambulatory Visit: Payer: Self-pay | Admitting: Oncology

## 2012-12-21 ENCOUNTER — Ambulatory Visit: Payer: Self-pay | Admitting: Oncology

## 2013-01-11 LAB — CBC CANCER CENTER
Eosinophil #: 0 x10 3/mm (ref 0.0–0.7)
HCT: 32.5 % — ABNORMAL LOW (ref 40.0–52.0)
HGB: 10.5 g/dL — ABNORMAL LOW (ref 13.0–18.0)
Lymphocyte #: 1.3 x10 3/mm (ref 1.0–3.6)
Lymphocyte %: 20.5 %
MCH: 23.5 pg — ABNORMAL LOW (ref 26.0–34.0)
MCV: 73 fL — ABNORMAL LOW (ref 80–100)
Monocyte #: 0.6 x10 3/mm (ref 0.2–1.0)
Neutrophil #: 4.3 x10 3/mm (ref 1.4–6.5)
Neutrophil %: 68.4 %

## 2013-01-11 LAB — COMPREHENSIVE METABOLIC PANEL
Albumin: 3.4 g/dL (ref 3.4–5.0)
Anion Gap: 7 (ref 7–16)
Bilirubin,Total: 0.4 mg/dL (ref 0.2–1.0)
Calcium, Total: 9 mg/dL (ref 8.5–10.1)
EGFR (African American): >60
EGFR (Non-African Amer.): >60
SGOT(AST): 12 U/L — ABNORMAL LOW (ref 15–37)
Sodium: 133 mmol/L — ABNORMAL LOW (ref 136–145)
Total Protein: 7.8 g/dL (ref 6.4–8.2)

## 2013-01-21 ENCOUNTER — Ambulatory Visit: Payer: Self-pay | Admitting: Oncology

## 2013-03-23 ENCOUNTER — Ambulatory Visit: Payer: Self-pay | Admitting: Radiation Oncology

## 2013-04-02 ENCOUNTER — Inpatient Hospital Stay: Payer: Self-pay | Admitting: Student

## 2013-04-02 LAB — COMPREHENSIVE METABOLIC PANEL
Albumin: 3 g/dL — ABNORMAL LOW (ref 3.4–5.0)
Alkaline Phosphatase: 139 U/L — ABNORMAL HIGH (ref 50–136)
Anion Gap: 7 (ref 7–16)
BUN: 17 mg/dL (ref 7–18)
Chloride: 100 mmol/L (ref 98–107)
Co2: 27 mmol/L (ref 21–32)
EGFR (African American): >60
EGFR (Non-African Amer.): >60
Glucose: 91 mg/dL (ref 65–99)
Osmolality: 269 (ref 275–301)
Potassium: 3.4 mmol/L — ABNORMAL LOW (ref 3.5–5.1)
SGPT (ALT): 11 U/L — ABNORMAL LOW (ref 12–78)
Sodium: 134 mmol/L — ABNORMAL LOW (ref 136–145)
Total Protein: 6.8 g/dL (ref 6.4–8.2)

## 2013-04-02 LAB — TROPONIN I: Troponin-I: 0.08 ng/mL — ABNORMAL HIGH

## 2013-04-02 LAB — CBC
HGB: 7.7 g/dL — ABNORMAL LOW (ref 13.0–18.0)
MCV: 67 fL — ABNORMAL LOW (ref 80–100)
RBC: 3.64 10*6/uL — ABNORMAL LOW (ref 4.40–5.90)
WBC: 4.8 10*3/uL (ref 3.8–10.6)

## 2013-04-02 LAB — CK TOTAL AND CKMB (NOT AT ARMC): CK-MB: 1.3 ng/mL (ref 0.5–3.6)

## 2013-04-03 LAB — URINALYSIS, COMPLETE
Bacteria: NONE SEEN
Ketone: NEGATIVE
Ph: 7 (ref 4.5–8.0)
Protein: NEGATIVE
RBC,UR: <1 /HPF (ref 0–5)
Specific Gravity: 1.017 (ref 1.003–1.030)
Squamous Epithelial: <1

## 2013-04-03 LAB — CBC WITH DIFFERENTIAL/PLATELET
Basophil %: 1.4 %
Eosinophil #: 0 10*3/uL (ref 0.0–0.7)
Eosinophil %: 0.2 %
Lymphocyte #: 1.2 10*3/uL (ref 1.0–3.6)
Lymphocyte %: 20.6 %
MCH: 22.5 pg — ABNORMAL LOW (ref 26.0–34.0)
Monocyte #: 0.6 x10 3/mm (ref 0.2–1.0)
Neutrophil #: 4 10*3/uL (ref 1.4–6.5)
Neutrophil %: 67.7 %
RBC: 3.73 10*6/uL — ABNORMAL LOW (ref 4.40–5.90)
RDW: 19.3 % — ABNORMAL HIGH (ref 11.5–14.5)
WBC: 5.9 10*3/uL (ref 3.8–10.6)

## 2013-04-03 LAB — BASIC METABOLIC PANEL
Anion Gap: 6 — ABNORMAL LOW (ref 7–16)
BUN: 18 mg/dL (ref 7–18)
Chloride: 103 mmol/L (ref 98–107)
EGFR (Non-African Amer.): >60
Glucose: 88 mg/dL (ref 65–99)
Osmolality: 273 (ref 275–301)

## 2013-04-03 LAB — TROPONIN I: Troponin-I: 0.07 ng/mL — ABNORMAL HIGH

## 2013-04-04 LAB — BASIC METABOLIC PANEL
Anion Gap: 7 (ref 7–16)
Calcium, Total: 8.2 mg/dL — ABNORMAL LOW (ref 8.5–10.1)
Co2: 26 mmol/L (ref 21–32)
EGFR (Non-African Amer.): >60
Glucose: 87 mg/dL (ref 65–99)
Sodium: 133 mmol/L — ABNORMAL LOW (ref 136–145)

## 2013-04-23 ENCOUNTER — Ambulatory Visit: Payer: Self-pay | Admitting: Radiation Oncology

## 2013-04-23 DEATH — deceased

## 2013-10-01 IMAGING — CT CT CHEST W/ CM
1 series · 15 of 32 positions shown, 19 images · IV contrast (agent unspecified)
Comparison: 03/01/2012, 09/24/2011

REASON FOR EXAM: Include adrenals Lung Cancer restaging
COMMENTS:

PROCEDURE:     CT  - CT CHEST WITH CONTRAST  - January 13, 2013  [DATE]
RESULT:     Indication: Lung cancer
TECHNIQUE: Multiple axial images of the chest are obtained with 75 mL of
Vsovue-C73 intravenous contrast.

[Series 2: soft tissue · axial · 0.74mm/px · z∈[-637,-340]mm · 15 of 111 slices shown, 19 images]
[im 8/111  soft-tissue]
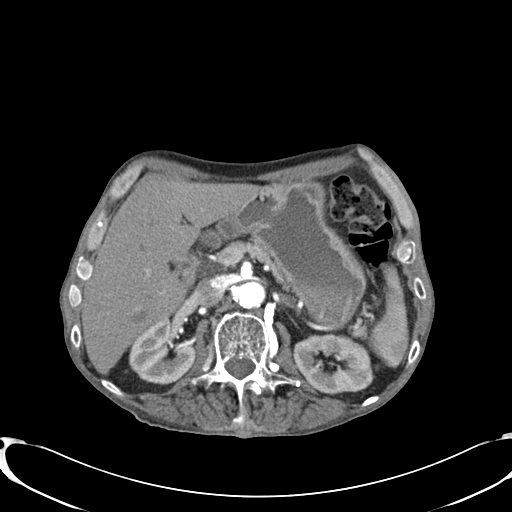
[im 8/111  bone]
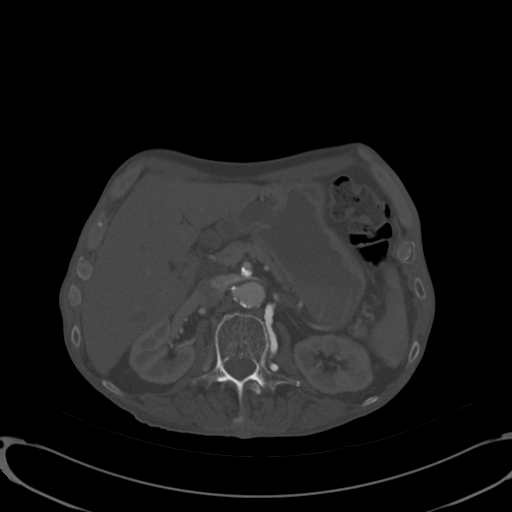
[im 15/111  soft-tissue]
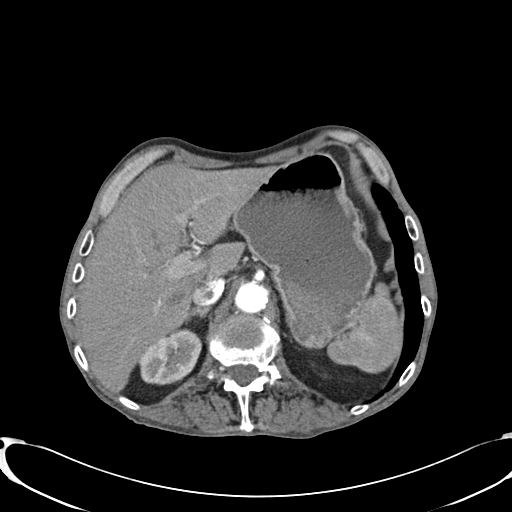
[im 22/111  soft-tissue]
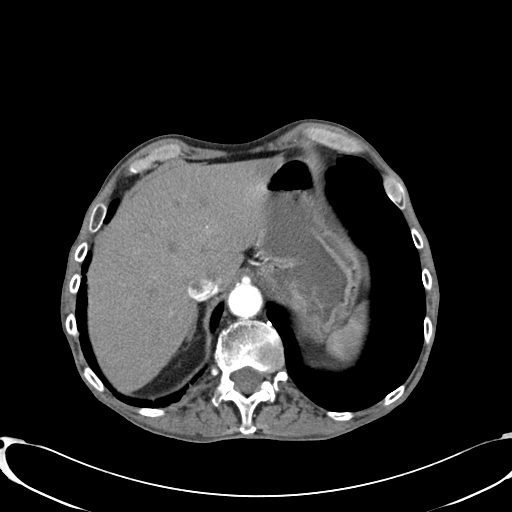
[im 32/111  soft-tissue]
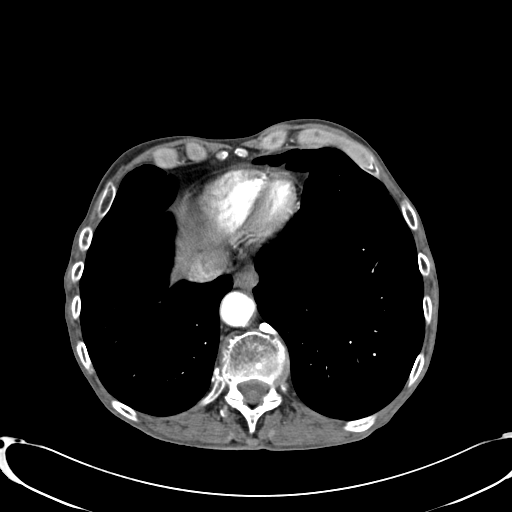
[im 40/111  soft-tissue]
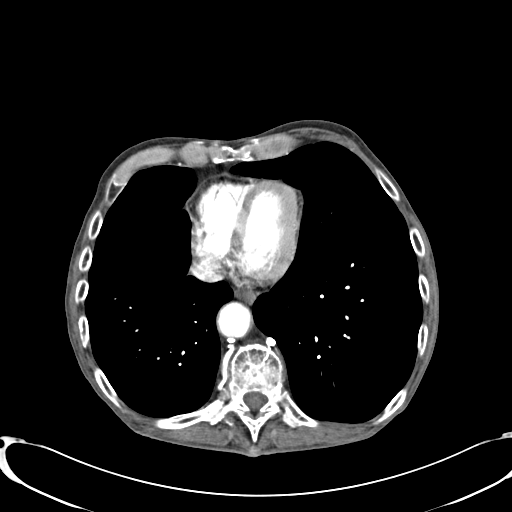
[im 47/111  soft-tissue]
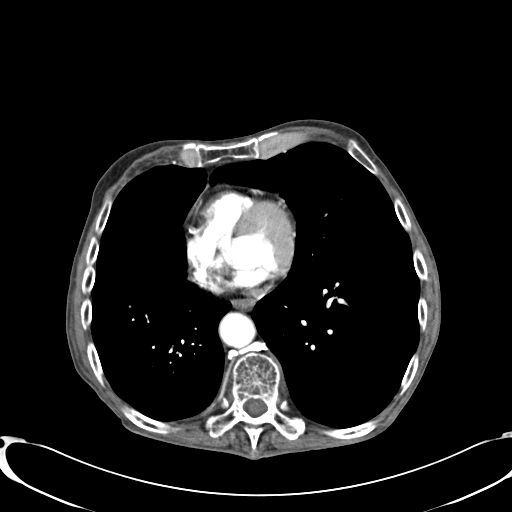
[im 57/111  soft-tissue]
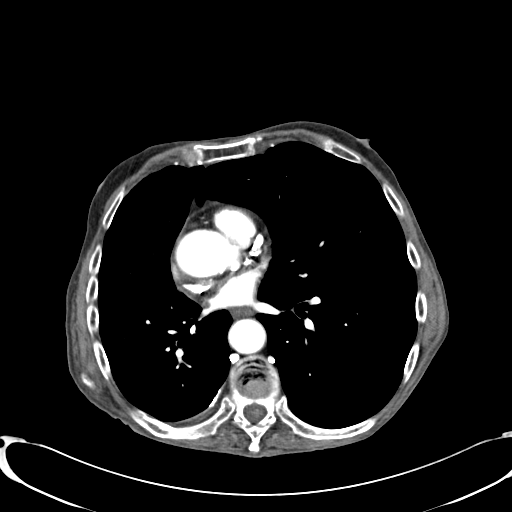
[im 64/111  soft-tissue]
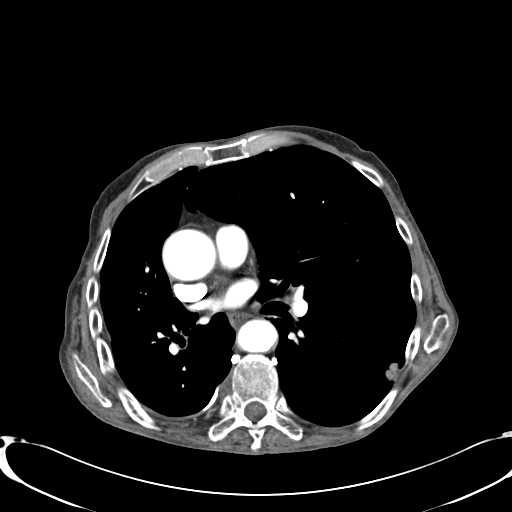
[im 71/111  soft-tissue]
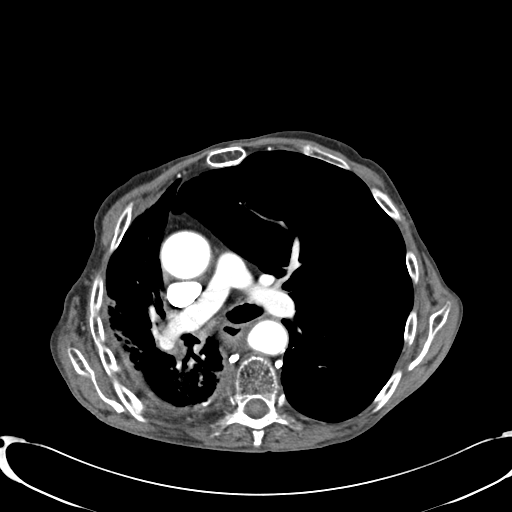
[im 71/111  bone]
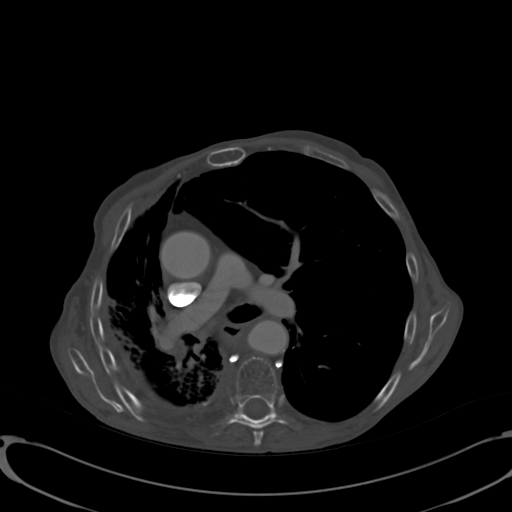
[im 79/111  soft-tissue]
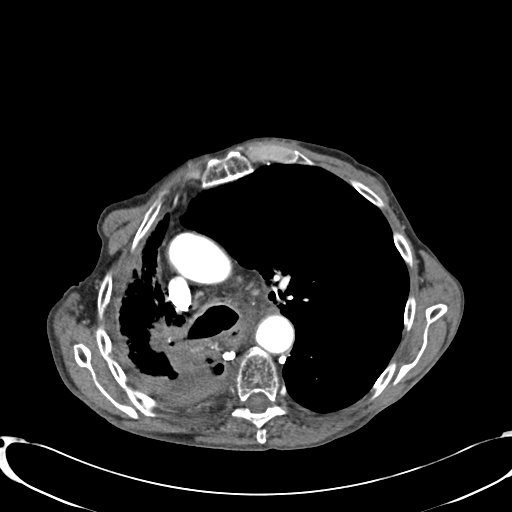
[im 89/111  soft-tissue]
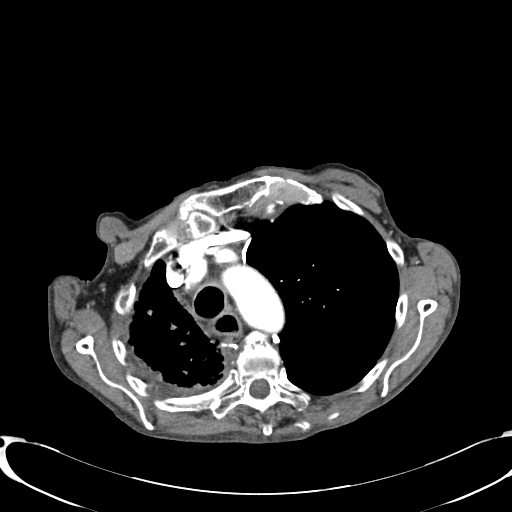
[im 96/111  soft-tissue]
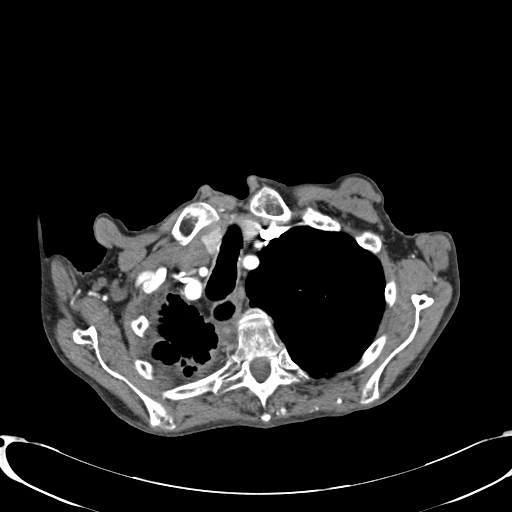
[im 96/111  lung]
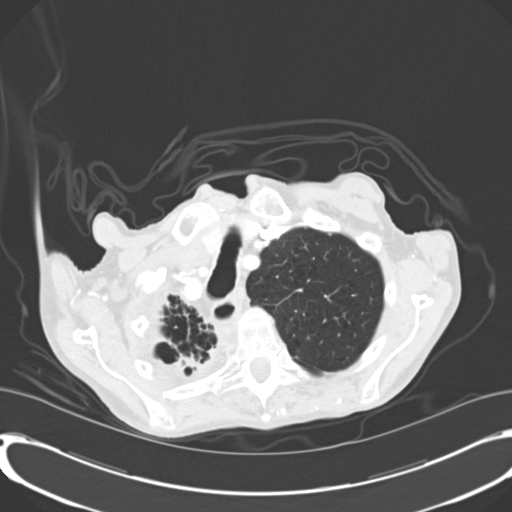
[im 100/111  lung]
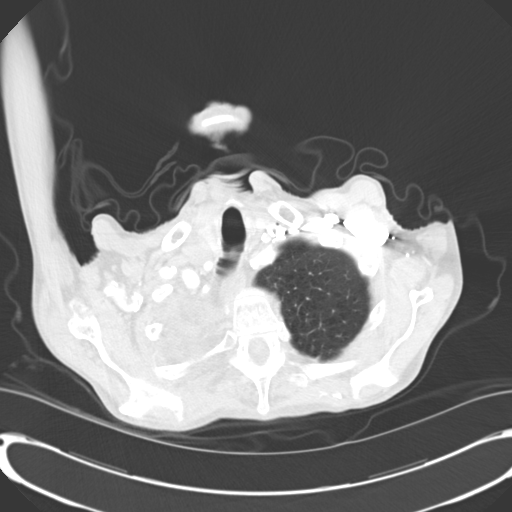
[im 103/111  soft-tissue]
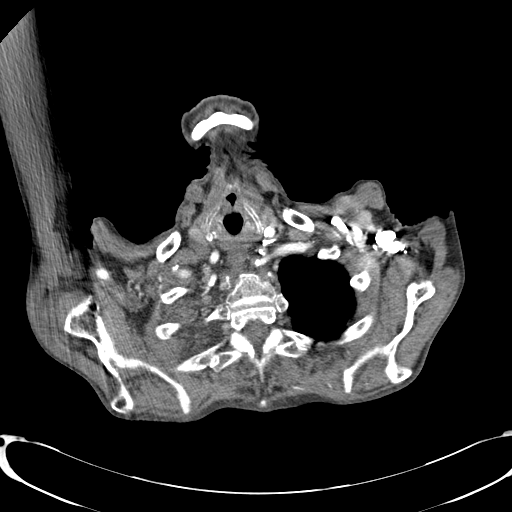
[im 103/111  lung]
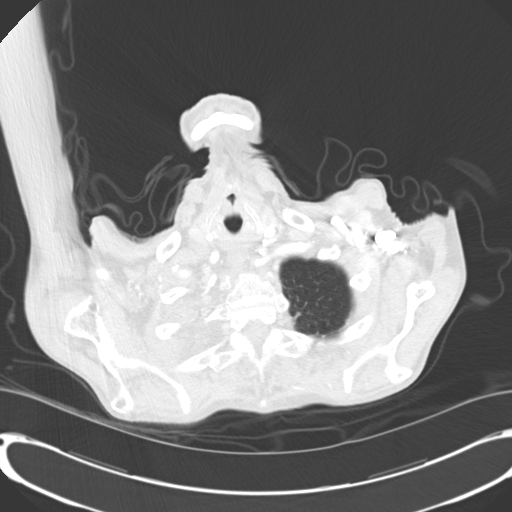
[im 107/111  lung]
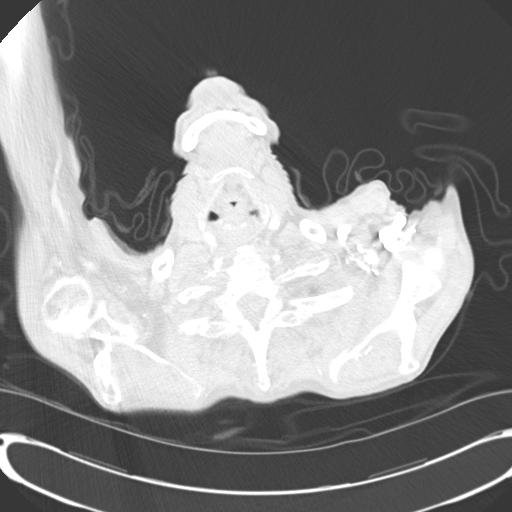

[15 of 32 positions shown; findings below may reference images not displayed]

FINDINGS: There is right lung volume loss. There is right perihilar soft tissue
prominence measuring approximately 3.8 x 2.9 cm which is similar to the
prior examination of 03/01/2012 concerning for persistent malignancy. There is
right long pleural thickening with associated calcifications which is
unchanged from the prior exams. There is a 19 mm left lower lobe pulmonary
nodule which has significantly increased in size compared to the prior
examination at which time the nodule measured 6 mm. There is no pneumothorax.

There are no pathologically enlarged axillary, hilar, or mediastinal lymph
nodes.

The heart size is normal. There is no pericardial effusion. The thoracic
aorta is normal in caliber.  There is coronary artery atherosclerosis.

Review of bone windows demonstrates no focal lytic or sclerotic lesions.

Limited noncontrast images of the upper abdomen were obtained. The adrenal
glands appear normal. The remainder of the visualized abdominal organs are
unremarkable.
IMPRESSION: 1. There is overall interval progression of disease with interval
enlargement of a left lower lobe pulmonary nodule currently measuring 19 mm
compared with the prior examination at which time the nodule measured 6 mm.
There is persistent right perihilar soft tissue mass which is similar in
appearance compared with 03/01/2012 consistent with persistent malignancy.

2. Coronary artery disease.

[REDACTED]

## 2014-10-10 NOTE — H&P (Signed)
PATIENT NAME:  Keith Chambers, Keith Chambers MR#:  [id number] DATE OF BIRTH:  07/31/1945  DATE OF ADMISSION:  04/28/2012  PRIMARY CARE PHYSICIAN: Rolm GalaHeidi Grandis, MD  ER REFERRING PHYSICIAN: Maurilio LovelyNoelle McLaurin, MD  CHIEF COMPLAINT: Altered mental status, confusion.   HISTORY OF PRESENT ILLNESS: The patient is a 69 year old Caucasian male with metastatic adenocarcinoma of the lung who is deemed a non-chemo candidate who has hospice following him. However, he is still a FULL CODE. The patient was brought from home because he was confused. The patient fell around 9:00 a.m. this morning and EMS was called. When they got there, the family states that he was confused. He had slept most of the day. When he arrived, he had some sort of bugs on him. He was decontaminated in the Emergency Department. He has been confused and we are asked to admit the patient. The patient otherwise is awake but is not able to give me any meaningful history. The ED physician did speak to the patient's daughter who states that she was confused about his lung cancer. According to the notes that I have reviewed from Dr. Doylene Canninghoksi, he has stated that he has discussed with the patient's family that there is not much that could be done with the patient. At this time, no other review of systems is obtainable. He is noted to have leukocytosis. He was also noted to have an elevated creatinine and there is also some concern about possible pneumonia. The patient does currently have coughing.   PAST MEDICAL HISTORY:  1. History of chronic obstructive pulmonary disease. 2. Metastatic adenocarcinoma of the lungs, diagnosed 03/25/2011; right upper lobe mass with adjacent nodule. [facility]. Started on carboplatin and Alimta from 04/23/2011. Finished three cycles of the chemotherapy with carboplatin and Alimta, January 2013. Started radiation and chemotherapy on 01/17. Treatments were held due to cerebrovascular accident in April 2013.   3. History of cerebrovascular accident. 4. History of coronary artery disease with myocardial infarction in the past.   ALLERGIES: Codeine.   SOCIAL HISTORY: Continues to apparently smoke, according to the note by oncology. There is a history of drinking 1/2 gallon of hard liquor daily.   PAST SURGICAL HISTORY: Status post cataract surgery.   CURRENT MEDICATIONS:  1. Chlorpromazine 50 mg 1 to 2 tabs every 4 to 6 p.r.n. for psychosis. 2. Fentanyl 100 mcg one patch, change every 72 hours.  3. Lorazepam 0.5 mg 1 to 2 tabs every 4 to 6 hours p.r.n. 4. Meloxicam 15 mg daily. 5. Milk of Magnesia 10 mL daily.  6. Omeprazole 20 mg 1 tab p.o. twice a day. 7. Oxycodone 15 mg every 6 hours p.r.n.  8. KCl 10 milliequivalents one tablet p.o. twice a day. 9. Prednisone 20 mg daily. 10. Risperdal 1 mg 1 tab p.o. every 12 hours.  11. Senna Plus 1 to 2 tabs daily.  12. Trazodone 100 mg at bedtime.  13. Tussionex 5 mL every 12 hours p.r.n. for cough.  14. Valium 5 mg every 6 hours p.r.n. for anxiety.   REVIEW OF SYSTEMS: Unobtainable.   PHYSICAL EXAMINATION:   VITAL SIGNS: Temperature 100, pulse 104, respirations 22, blood pressure 154/86, and O2 98% on 2 liters.   GENERAL: The patient is a disheveled-looking elderly male in no acute distress.   HEENT: Head atraumatic, normocephalic. Pupils equally round and reactive to light and accommodation. There is no conjunctival pallor. No scleral icterus. Nasal exam shows no drainage or ulceration. Oropharynx is clear  without any exudates. He has got very dry posterior pharynx without any exudates.   NECK: No thyromegaly. No carotid bruits.   CARDIOVASCULAR: Regular rate and rhythm, tachycardic. No murmurs, rubs, clicks, or gallops. PMI is not displaced.   PULMONARY: Bilateral rhonchi throughout both lungs.   ABDOMEN: Soft, nontender, and nondistended. Positive bowel sounds x4.   EXTREMITIES: No clubbing, cyanosis, or edema.   SKIN: Currently no  rash.   LYMPHATICS: No lymph nodes palpable.   VASCULAR: Good DP and PT pulses.   PSYCHIATRIC: Currently not anxious. He is not oriented to person, place, or time.   NEUROLOGIC: Limited examination; the patient is not following commands properly to do a full neuro exam.   EVALUATIONS: Glucose 84, BUN 24, creatinine 1.40, sodium 138, potassium 4.5, chloride 101, CO2 26, and calcium 9.1. WBC 17.7, hemoglobin 12.9, and platelet count 230.   CT scan of the head without contrast showed no acute intracranial processes.  C-spine showed no acute osseous injury.  X-ray shows there are chronic appearing changes in the right hemithorax, likely related to his lung cancer. Questionable infiltrate in the right lower lobe.   ASSESSMENT AND PLAN: The patient is a 69 year old white male male with metastatic lung cancer followed by hospice, but is still FULL CODE who was confused earlier. Brought to the ED and had some sort of bugs on him earlier and had to be decontaminated. He has noticed to have leukocytosis, tachycardia, low-grade fevers, and chest x-ray shows possible pneumonia.  1. Acute encephalopathy, possibly due to infection. This could also be related to his multiple medications, also could be a baseline. I will also check an ammonia level. His CT scan shows no evidence of metastatic disease.  2. Acute renal failure, likely due to dehydration. We will give him IV fluids. 3. Possible pneumonia. Could be obstructive pneumonia from cancer. We will treat him with IV Levaquin, although his leukocytosis could be just related to chronic prednisone therapy.  4. Metastatic lung cancer. He has been told by oncology not a chemotherapy candidate due to multiple medical problems. Still FULL CODE. The ED MD spoke to his daughter. She will talk to her other family to decide on the code. The patient needs to be DO NOT RESUSCITATE.  5. Chronic obstructive pulmonary disease. We will do nebulizers p.r.n.  6. History  of cerebrovascular accident. We will place him on aspirin.  7. History of coronary artery disease. We will place him on aspirin.   PROGNOSIS: Very poor. The patient has a very high mortality related to his lung cancer.   TIME SPENT: 45 minutes.  ____________________________ Lacie Scotts Allena Katz, MD shp:slb D: 04/28/2012 21:23:30 ET T: 04/29/2012 07:43:24 ET JOB#: [id number]  cc: Danta Baumgardner H. Allena Katz, MD, <Dictator> Letitia Caul, MD Charise Carwin MD ELECTRONICALLY SIGNED 05/06/2012 13:29

## 2014-10-10 NOTE — Consult Note (Signed)
    Comments   Received return call from pt's daughter who has discussed care with pt's step-son. They would like patient to be a DNR but continue current medical treatment to see if pt responds. Will change code status to reflect this. Note that per daughter and other family, pt is still legally married to his wife, Keith Chambers, who is currently residing in an unknown nursing home. Family say they have contacted her and she is not interested in making medical decisions as she and pt have been seperated for many years. However upon request, family was able to provide me a cell phone # for pt's wife - [phone]. I have attempted to call this number several times without success. Will continue to try to make a good faith attempt to contact pt's wife to clarify medical decisons. If patient's mental status improves, will proceed with arranging HCPOA for decision-making.  spoke with pt's step-daughter-in-law,Keith Chambers [phone]), and niece, Keith Chambers [phone]). They say that patient has told them many times in the past that he wanted to be a DNR.  35 minutes  Electronic Signatures for Addendum Section:  Phifer, Keith Chambers (MD) (Signed Addendum [phone]-Nov-13 20:16)  Agree with assessment and plan as outlined in above note. Discussed with pt's family at length.   Electronic Signatures: Borders, Keith Chambers (NP)  (Signed [phone]-Nov-13 17:05)  Authored: Palliative Care Phifer, Keith Chambers (MD)  (Signed [phone]-Nov-13 20:15)  Authored: Palliative Care   Last Updated: 07-Nov-13 20:16 by Phifer, Keith Chambers (MD)

## 2014-10-10 NOTE — Discharge Summary (Signed)
PATIENT NAME:  Keith Chambers, Keith Chambers MR#:  [id number] DATE OF BIRTH:  [birth year]  DATE OF ADMISSION:  04/28/2012 DATE OF DISCHARGE:  05/03/2012  ADMITTING DIAGNOSIS: Altered mental status, confusion.   DISCHARGE DIAGNOSES:  1. Acute encephalopathy due to Escherichia coli sepsis as a result of pneumonia, now on p.o. Levaquin.  2. Acute cerebrovascular accident status post evaluation with carotid Doppler's showing bilateral carotid stenosis. Unfortunately not a surgical candidate due to his lung cancer.  3. History of chronic obstructive pulmonary disease.  4. Metastatic adenocarcinoma of the lung, diagnosed 03/25/2011. Right upper lobe mass with suggestion of nodules.  Started on carboplatin and Alimta from 04/23/2011. Finished three cycles of chemotherapy with carboplatin and Alimta January 2013. Started on radiation chemotherapy. Treatments were held due to cerebrovascular accident in April 2013.  5. History of coronary artery disease with myocardial infarction in the past.  6. Acute renal failure, resolved with IV fluids.  7. Likely sepsis present on admission due to pneumonia.   PERTINENT LABS AND EVALUATIONS: EKG showed normal sinus rhythm without any ST-T wave changes.   Admitting WBC count 17.7, hemoglobin 12.9, and platelet count 233.   BMP: Glucose 84, BUN 24, creatinine 1.4, serum sodium 138, potassium 4.5, chloride 101, CO2 26, and calcium 9.1.   Chest x-ray, PA and lateral, showed prominence of linear band or airspace disease as well as right suprahilar mass noted on the right.   Blood culture showed Escherichia coli, pansensitive.   Urinalysis was negative.   CT scan of the spine showed no acute cervical spine disease noted.   CT scan of the head showed no acute abnormality noted on CT scan of the head.  Creatinine on 04/29/2012 was 1.01. His most recent WBC count on 04/30/2012 is 10.0.   MRI of the brain without contrast showed tiny foci of restricted diffusion in the right  parietal lobe and right posterior parietal lobe concerning for tiny acute infarcts.  Bilateral carotid Doppler showed prominent calcified plaques in both carotid bifurcations with 75% bilateral stenosis.   HOSPITAL COURSE: Please refer to the history and physical done by the admitting physician. The patient is a 69 year old Caucasian male with metastatic adenocarcinoma of the lung who is deemed a non-chemotherapy candidate who has home hospice following him who was sent from home for altered mental status. The patient in the ED was noted to have acute encephalopathy, also acute renal failure, also was thought to have possible pneumonia with leukocytosis. It turned out his blood cultures did come back positive for Escherichia coli. The patient was treated with IV Levaquin, which was sensitive to the coli. With the treatment of his pneumonia, his symptoms did improve. Currently his mental status is close to baseline. The patient also had a MRI that suggested a possible acute stroke. He had carotid Doppler's which showed bilateral carotid stenosis. Due to his metastatic lung cancer, he is not a candidate for any surgical intervention. I have discussed this with his health care power of attorney. She understands. He was also seen by palliative care team and they had made him DO NOT RESUSCITATE. At this time he is being arranged to go to [facility] for further rehab.   DISCHARGE MEDICATIONS:  1. Lorazepam 0.5 mg 1 to 2 tabs every 4 to 6 hours p.r.n. anxiety/nervousness. 2. Meloxicam 15 mg daily.  3. Milk of Magnesia 10 mL daily.  4. Omeprazole 20 mg 1 tab p.o. twice a day. 5. Oxycodone 15 mg every 6 hours p.r.n.  6. KCl 10 mEq 1 tab p.o. twice a day. 7. Prednisone 20 mg daily.  8. Risperidone 1 mg one tab p.o. every 12 hours p.r.n. for psychosis.  9. Senna Plus 1 to 2 tabs daily.  10. Trazodone 100 mg 1 tab p.o. daily.  11. Valium 5 mg every 6 hours at bedtime. 12. Chlorpromazine 50 mg 1 to 2  tabs every 4 to 6 hours p.r.n. for psychosis. 13. Fentanyl 100 mcg change every 72 hours.  14. Tylenol 650 mg every four hours p.r.n. for pain.  15. Levofloxacin 750 mg 1 tab p.o. every 24 hours for 10 more days.  16. Albuterol/Atrovent MDI 2 to 4 puffs every six hours p.r.n. wheezing.  17. Aspirin 81 mg one tab p.o. daily.  18. Atorvastatin 10 mg at bedtime.   HOME OXYGEN: None.   DIET: Low sodium, low fat.   ACTIVITY: As tolerated with a walker. PT and OT evaluation. Resume hospice at the facility.   DISCHARGE FOLLOWUP: Follow-up with Dr. Darrick Huntsmanullo, primary physician, in 1 to 2 weeks.   TIME SPENT: 45 minutes. ____________________________ Lacie ScottsShreyang H. Allena KatzPatel, MD shp:slb D: 05/03/2012 12:13:10 ET T: 05/03/2012 12:40:01 ET JOB#: [id number]  cc: Sativa Gelles H. Allena KatzPatel, MD, <Dictator> Duncan Dulleresa Tullo, MD Charise CarwinSHREYANG H Phoenix Riesen MD ELECTRONICALLY SIGNED 05/06/2012 13:31

## 2014-10-13 NOTE — Discharge Summary (Signed)
PATIENT NAME:  Keith Chambers, Keith Chambers MR#:  [id number] DATE OF BIRTH:  Jun 22, 1946  DATE OF ADMISSION:  04/02/2013 DATE OF DISCHARGE:  04/06/2013  CONSULTANTS: Dr. Orlie DakinFinnegan from oncology, Dr. Rushie Chestnuthrystal from radiation oncology, Dr. Doylene Canninghoksi from oncology.   CHIEF COMPLAINT: Status post fall and altered mental status.   DISPOSITION:  The patient will be getting discharged to hospice home for comfort care measures.   DISCHARGE DIAGNOSES: 1.  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  2.  Symptomatic anemia, acute on chronic.  3.  History of lung cancer with metastasis, followed by hospice as an outpatient.  4.  Chronic pain syndrome.  6.  Gastroesophageal reflux disease.    7.  Chronic obstructive pulmonary disease.  8.  Elevated troponin, likely demand ischemia.  9.  History of ongoing tobacco abuse.  10.  Hypertension.   DISCHARGE MEDICATIONS: He will be getting discharged to hospice home with milk of magnesia 8%, 10 mL once a day; Senna Plus 1 to 2 tabs once a day, Duragesic 75 mcg patch every 72 hours, oxycodone 5 mg per 5 mL please take 5 mL every 8 hours, albuterol/ipratropium nebulizers 1 puff 4 times a day and dexamethasone 4 mg 1 tab 2 times a day.   CODE STATUS:  He is DO NOT RESUSCITATE.   DIET: As tolerated.   ACTIVITY: As tolerated.   HISTORY OF PRESENT ILLNESS AND HOSPITAL COURSE: For full details of H and P, please see the dictation on day of admission by Dr. Cherlynn KaiserSainani, but briefly this is a 69 year old male with a history of lung cancer, history of radiation therapy and chemo, not doing well and was followed by hospice at one time, came in for weakness and had a fall. He was brought into the hospital with some anemia, hemoglobin of 7.7 and CT suggesting multiple intracranial lesions consistent with metastatic disease and admitted to the hospitalist service. He was noted to have hemoglobin of 7.7 on admission with  initially mild troponin of 0.08. He did receive some PRBC for symptomatic anemia. He does have history of anemia in the past and had hemoglobin of 10.5 on 07/22. This is likely anemia of chronic disease. There was no acute active bleeding. He did improve and after the CAT scan an MRI was done but it was limited secondary to motion artifact. The altered mental status did resolve. He was started on Decadron for the vasogenic edema and palliative radiation and oncology was consulted. He will be started on palliative whole-brain radiation and overall has a very poor prognosis. He is being followed by hospice and hospice was consulted and upon further conversations with the family it is determined that the best course of action for him would be transferring to hospice home for palliative and comfort care measures. Troponins were positive but flat, likely demand ischemia. He had no COPD flare or significant wheezing.   TOTAL TIME SPENT: 33 minutes.   CODE STATUS: The patient is DO NOT RESUSCITATE.    ____________________________ Krystal EatonShayiq Rollo Farquhar, MD sa:cs D: 04/06/2013 16:44:00 ET T: 04/06/2013 19:54:44 ET JOB#: [id number]  cc: Krystal EatonShayiq Katherleen Folkes, MD, <Dictator> Krystal EatonSHAYIQ Skyleen Bentley MD ELECTRONICALLY SIGNED 04/22/2013 14:51

## 2014-10-13 NOTE — Consult Note (Signed)
Reason for Visit: This 69 year old Male patient presents to the clinic for initial evaluation of  brain metastases .   Referred by Dr. Orlie Dakin.  Diagnosis:  Chief Complaint/Diagnosis   69 year old male with stage IV lung cancer now with brain metastasis  Imaging Report MRI scans reviewed   Referral Report clinicalnotes reviewed   Planned Treatment Regimen palliative whole brain radiation   HPI   patient is a 69 year old male well known to our Department  completed concurrent chemotherapy and radiation therapy for stage IIIB lung cancer about a year and a half prior. He had a 6 x 5.2 cm mass in the right upper lobe and N3 nodes by PET CT criteria. Toward the completion of his radiation chemotherapy he had a CVA required hospitalization.recently has been under the care of hospice found to be unresponsive by his daughter brought to emergency room was with was found to have multiple bilateral enhancing lesions in the brain compatible with metastatic disease. I've asked to evaluate the patient for possible palliative radiation therapy to his whole brain. I believe he has been accepted to hospice home for terminal care.patient is a rather poor historian although multiple family members are there for information.  Past Hx:    Tobacco Use: per chart   Emphysema: per chart   Lung Cancer:    HTN:    COPD:    MI - Myocardial Infarct:    Alcohol Abuse:   Past, Family and Social History:  Past Medical History positive   Cardiovascular hypertension; myocardial infarction   Respiratory COPD; emphysema   Family History noncontributory   Social History positive   Social History Comments greater than 50-pack-year smoking history, EtOH abuse history   Allergies:   Codeine: Other  Home Meds:  Home Medications: Medication Instructions Status  oxycodone 5 mg oral capsule 1 tab(s) orally 3 times a day  Active  Duragesic-75 1 patch transdermal  every 72 hours   Active  potassium  chloride 10 mEq oral tablet, extended release 1 tab(s) orally 2 times a day  HOSPICE PATIENT Active  hydrochlorothiazide 12.5 mg oral capsule 1 tab(s) orally once a day  hospice patient  Active  aspirin 81 mg oral delayed release tablet 1 tab(s) orally once a day Active  albuterol-ipratropium 103 mcg-18 mcg/inh inhalation aerosol 2 puff(s) inhaled 4 times a day, As Needed- for Wheezing  Active  meloxicam 15 mg tablet 1 tab(s) orally once a day Active  Milk of Magnesia 8% oral suspension 10 milliliter(s) orally once a day Active  omeprazole 20 mg oral delayed release capsule 1 cap(s) orally 2 times a day Active  Senna Plus 50 mg-8.6 mg oral tablet 1-2 tab(s) orally once a day Active   Review of Systems:  Performance Status (ECOG) 1   Skin negative   Breast negative   Ophthalmologic negative   ENMT negative   Respiratory and Thorax see HPI   Cardiovascular negative   Gastrointestinal negative   Genitourinary negative   Musculoskeletal negative   Neurological negative   Psychiatric negative   Hematology/Lymphatics negative   Allergic/Immunologic negative   Physical Exam:  General/Skin/HEENT:  Additional PE thin frail male it seen in his hospital bed in NAD. He is fairly unresponsive answering questions. Crude visual fields are within normal range. Motor sensory and DTR levels are equal and symmetric in the upper or lower extremities. Cranial nerves II through XII are grossly intact lungs are clear to A&P cardiac examination shows regular rate and rhythm.  Breasts/Resp/CV/GI/GU:  Respiratory and Thorax normal   Cardiovascular normal   Gastrointestinal normal   Genitourinary normal   MS/Neuro/Psych/Lymph:  Musculoskeletal normal   Neurological normal   Lymphatics normal   Other Results:  Radiology Results: LabUnknown:    13-Oct-14 14:06, MRI Brain  With/Without Contrast  PACS Image   MRI:  MRI Brain  With/Without Contrast   REASON FOR EXAM:    mets  to brain  COMMENTS:       PROCEDURE: MR  - MR BRAIN WO/W CONTRAST  - Apr 04 2013  2:06PM     RESULT: History: Brain.    Comparison Study: Comparison made to prior report of 04/30/2012.     Findings: This study is nondiagnosticdue to severe motion artifact.   Multifocal subcortical and periventricular edema noted particularly in   the left frontal region suggest the possibility of underlying metastatic   disease given the patient's history. No prominent hydrocephalus. Minimal   midline shift from left or right is present.  IMPRESSION:  Nondiagnostic exam. Multifocal areas of edema particularly   left frontal lobe are noted. This suggests the presence of metastatic   disease.        Verified By: Gwynn BurlyHOMAS E. REGISTER, M.D., MD   Relevent Results:   Relevant Scans and Labs MRI scan of the brain is reviewed   Assessment and Plan: Impression:   brain metastasis from known locally advanced non-small cell lung cancer in 69 year old male Plan:   at this time had a long discussion with the family regarding palliative whole brain radiation therapy. I recommended going ahead with 3000 cGy in 10 fractions. I have set him up for CT simulation tomorrow. Risks and benefits of treatment including hair loss irritation of the scalp alteration of blood counts and fatigue were all explained in detail to the family and patient. Family will discuss tamoxifen cells whether the benefits of going ahead with radiation at this time. I have set him up tentatively tomorrow for a simulation. I've also discussed the case personally with Dr. Doylene Canninghoksi.  I would like to take this opportunity to thank you for allowing me to continue to participate in this patient's care.  Electronic Signatures: Xara Paulding, Gordy CouncilmanGlenn S (MD)  (Signed 14-Oct-14 15:07)  Authored: HPI, Diagnosis, Past Hx, PFSH, Allergies, Home Meds, ROS, Physical Exam, Other Results, Relevent Results, Encounter Assessment and Plan   Last Updated: 14-Oct-14 15:07  by Rebeca Alerthrystal, Javaria Knapke S (MD)

## 2014-10-13 NOTE — Consult Note (Signed)
patient with carcinoma of lung metastases to brain.  I had prolonged discussion with hospice nurse and also with family and Dr. Elam Dutchrystalpatient is essentially unchanged.  Was examined today was not in any distress her vital signs remained stablecan be discharged to hospice home and family will bring him for palliative radiation therapywould be gradually taperedmanagement by hospice physician in hospice home.of care with hospice nurse, radiation oncologist 30 minutes or more  Electronic Signatures: Arwen Haseley, Gerome SamJanak K (MD)  (Signed on 15-Oct-14 17:15)  Authored  Last Updated: 15-Oct-14 17:15 by Laddie Aquashoksi, Vernee Baines K (MD)

## 2014-10-13 NOTE — H&P (Signed)
PATIENT NAME:  Keith Chambers, Keith Chambers MR#:  [id number] DATE OF BIRTH:  March 07, 1946  DATE OF ADMISSION:  04/02/2013  PRIMARY CARE PHYSICIAN: Dr. Doylene Canning  CHIEF COMPLAINT: Status post fall and altered mental status.   HISTORY OF PRESENT ILLNESS: This is a 69 year old male who was brought in to the hospital by his daughter due to altered mental status and after a fall. The patient has a history of lung cancer with mets, and has a history of radiation therapy and chemo, and was followed by hospice but had been doing well, and therefore, currently is not being followed by hospice. As per the daughter, the patient this morning was more weak, and apparently had a fall while trying to ambulate with his walker. The patient's daughter also says that he has been sort of confused at times, where he talks about things that are not current, and has some hallucinations at times. The patient apparently has these symptoms on and off, but improves; but this time it has not been improving. She therefore brought him to the ER for further evaluation. The patient himself is a fairly poor historian presently.   The patient, upon arrival to the ER, was noted to be anemic, with a hemoglobin of 7.7. He was also noted to have a CT scan of the head, which showed multiple intracranial lesions consistent with metastatic disease. Hospitalist services were contacted for further treatment and evaluation.   REVIEW OF SYSTEMS:  Otherwise unobtainable, given the patient's mental status.   PAST MEDICAL HISTORY:  Consistent with history of lung cancer with mets, hypertension, ongoing tobacco abuse, chronic pain syndrome, GERD,  COPD.   ALLERGIES:  CODEINE.   SOCIAL HISTORY: Still smokes about 3 to 4 packs per week, has been smoking for the past 50+ years. Also drinks vodka almost weekly. No other illicit drug abuse. Lives with his daughter.   FAMILY HISTORY:  The patient cannot recall, but does say that both mother and father died from old age.    CURRENT MEDICATIONS ARE AS FOLLOWS:  Combivent 2 puffs 4 times daily, aspirin 81 mg daily, Duragesic patch 75 mcg q. 72 hours, hydrochlorothiazide 12.5 mg daily, meloxicam 15 mg daily, milk of magnesia daily, omeprazole 20 mg b.i.d., oxycodone 5 mg t.i.d., potassium 10 mEq b.i.d., Senokot 1 to 2 tabs daily.   PHYSICAL EXAMINATION PRESENTLY IS AS FOLLOWS: VITAL SIGNS ARE NOTED TO BE:  Temperature is 99, pulse 102, respirations 20, blood pressure 95/56, sats 97% on 2 liters nasal cannula.  GENERAL: He is a lethargic, pale-appearing male, but in no apparent distress.  HEAD, EYES, EARS, NOSE, THROAT EXAM:  He is atraumatic, normocephalic. Extraocular muscles are intact. Pupils are equal and reactive to light. Sclerae anicteric. No conjunctival injection. No pharyngeal erythema.  NECK: Supple. There is no jugular venous distention. No bruits, no lymphadenopathy or thyromegaly.  HEART EXAM: Regular rate and rhythm. No murmurs, no rubs, no clicks. Distant heart sounds.  LUNGS: Clear to auscultation anteriorly. No rales or rhonchi. No wheezes.  ABDOMEN: Soft, flat, nontender, nondistended. Has good bowel sounds. No hepatosplenomegaly appreciated.  EXTREMITIES:  No evidence of any cyanosis, clubbing, or peripheral edema. Has +2 pedal and radial pulses bilaterally.  NEUROLOGIC: The patient is alert, awake, oriented x 1, globally weak. Difficult to follow neurological exam, but moves all extremities spontaneously.  SKIN EXAM:  Moist and warm, with no rashes.  LYMPHATIC: There is no cervical or axillary lymphadenopathy.   LABORATORY EXAM:  Showed a serum glucose of 91,  BUN 17, creatinine 0.8, sodium 138, potassium 3.4, chloride 100, bicarb 27. LFTs are within normal limits. Troponin 0.08. White cell count 4.8, hemoglobin 7.7, hematocrit 24.5, platelet count 372.   The patient did have a CT of the head done without contrast, which showed multiple intracranial lesions consistent with metastatic disease, given  patient's history of lung cancer.   ASSESSMENT AND PLAN: This is a 69 year old male with history of lung cancer with mets,  hypertension, ongoing tobacco abuse, chronic pain syndrome, GERD, COPD, who presents to the hospital after a fall and also with altered mental status.    1.  Altered mental status. The exact etiology of this is unclear, but likely to be multifactorial in nature, possibly due to underlying mild dementia and also patient taking increasing pain meds complicated with possible underlying brain mets as seen on the CT. There is no evidence of acute infectious etiology, like a urinary tract infection. For now, I will follow the patient's mental status. Avoid neurogenic meds like benzodiazepines.   2.  Symptomatic anemia. This is likely acute on chronic anemia. The patient presently is dizzy and lethargic. For now, I will go ahead and transfuse 1 unit of blood, and follow her hemoglobin.   3.  History of lung cancer with metastasis. The patient has a history of chemoradiation and was being followed by hospice, but was clinically doing better and therefore, no longer being followed by hospice. The patient likely now has recurrence as seen on the CT head. I will get an Oncology consult. Will also get a Palliative Care consult to discuss goals of care with the patient and his family.   4.  Chronic pain syndrome. Continue oxycodone.   5.  Gastroesophageal reflux disease. Continue Protonix.  6.  Chronic obstructive pulmonary disease. No acute exacerbation. Continue Combivent.  7.  Elevated troponin, likely in the setting of anemia and hypoxemia. No evidence of acute coronary syndrome. Will cycle his cardiac markers, since he is anemic, and cannot give him any anticoagulants presently.   The patient is a FULL CODE.   Time spent on admission is 50 minutes.     ____________________________ Rolly PancakeVivek J. Cherlynn KaiserSainani, MD vjs:mr D: 04/02/2013 21:28:41 ET T: 04/02/2013 21:57:09  ET JOB#: [id number]  cc: Rolly PancakeVivek J. Cherlynn KaiserSainani, MD, <Dictator> Houston SirenVIVEK J SAINANI MD ELECTRONICALLY SIGNED 04/03/2013 13:36

## 2014-10-13 NOTE — Consult Note (Signed)
History of Present Illness:  Reason for Consult History of lung cancer now with new brain metastasis.   HPI   Patient is a 69 year old male with a history of non-small cell lung carcinoma of the recently was under the care of hospice.  He was found unresponsive by his daughter and brought to the emergency room.  Subsequent CT scan of his head revealed multiple lesions highly suspicious for metastatic disease.  Currently, patient is obtunded and review of systems is unobtainable.  PFSH:  Additional Past Medical and Surgical History Hypertension, GERD, arthritis, kidney stones, COPD, cataract surgery.  Family history: Sister with lung cancer.  Social history:  Greater than 120 pack years smoking, patient by her port continues to smoke.  Heavy alcohol use.   Review of Systems:  Performance Status (ECOG) 4   Review of Systems   unobtainable   NURSING NOTES: **Vital Signs.:   13-Oct-14 04:32   Vital Signs Type: Routine   Temperature Temperature (F): 98.6   Celsius: 37   Temperature Source: oral   Pulse Pulse: 93   Respirations Respirations: 20   Systolic BP Systolic BP: 099   Diastolic BP (mmHg) Diastolic BP (mmHg): 63   Mean BP: 80   Pulse Ox % Pulse Ox %: 91   Pulse Ox Activity Level: At rest   Oxygen Delivery: Room Air/ 21 %   Physical Exam:  Physical Exam General: cachectic, ill-appearing Lungs: Clear to auscultation bilaterally. Heart: Regular rate and rhythm. No rubs, murmurs, or gallops. Abdomen: Soft, normoactive bowel sounds. Musculoskeletal: No edema, cyanosis, or clubbing. Neuro: obtunded Skin: No rashes or petechiae noted.    Codeine: Other    oxycodone 5 mg oral capsule: 1 tab(s) orally 3 times a day , Status: Active, Quantity: 90, Refills: None   Duragesic-75: 1 patch transdermal  every 72 hours    , Status: Active, Quantity: 10, Refills: None   potassium chloride 10 mEq oral tablet, extended release: 1 tab(s) orally 2 times a  day    HOSPICE PATIENT, Status: Active, Quantity: 30, Refills: None   hydrochlorothiazide 12.5 mg oral capsule: 1 tab(s) orally once a day  hospice patient , Status: Active, Quantity: 15, Refills: None   aspirin 81 mg oral delayed release tablet: 1 tab(s) orally once a day, Status: Active, Quantity: 30, Refills: None   albuterol-ipratropium 103 mcg-18 mcg/inh inhalation aerosol: 2 puff(s) inhaled 4 times a day, As Needed- for Wheezing , Status: Active, Quantity: 240, Refills: None   meloxicam 15 mg tablet: 1 tab(s) orally once a day, Status: Active, Quantity: 30, Refills: None   Milk of Magnesia 8% oral suspension: 10 milliliter(s) orally once a day, Status: Active, Quantity: 0, Refills: None   omeprazole 20 mg oral delayed release capsule: 1 cap(s) orally 2 times a day, Status: Active, Quantity: 60, Refills: 5   Senna Plus 50 mg-8.6 mg oral tablet: 1-2 tab(s) orally once a day, Status: Active, Quantity: 0, Refills: None  Laboratory Results: Routine Chem:  13-Oct-14 05:11   Glucose, Serum 87  BUN 17  Creatinine (comp) 0.73  Sodium, Serum  133  Potassium, Serum 3.7  Chloride, Serum 100  CO2, Serum 26  Calcium (Total), Serum  8.2  Anion Gap 7  Osmolality (calc) 267  eGFR (African American) >60  eGFR (Non-African American) >60 (eGFR values <32m/min/1.73 m2 may be an indication of chronic kidney disease (CKD). Calculated eGFR is useful in patients with stable renal function. The eGFR calculation will not be reliable in acutely ill patients  when serum creatinine is changing rapidly. It is not useful in  patients on dialysis. The eGFR calculation may not be applicable to patients at the low and high extremes of body sizes, pregnant women, and vegetarians.)   Radiology Results: MRI:    13-Oct-14 14:06, MRI Brain  With/Without Contrast  MRI Brain  With/Without Contrast   REASON FOR EXAM:    mets to brain  COMMENTS:       PROCEDURE: MR  - MR BRAIN WO/W CONTRAST  - Apr 04 2013   2:06PM     RESULT: History: Brain.    Comparison Study: Comparison made to prior report of 04/30/2012.     Findings: This study is nondiagnosticdue to severe motion artifact.   Multifocal subcortical and periventricular edema noted particularly in   the left frontal region suggest the possibility of underlying metastatic   disease given the patient's history. No prominent hydrocephalus. Minimal   midline shift from left or right is present.  IMPRESSION:  Nondiagnostic exam. Multifocal areas of edema particularly   left frontal lobe are noted. This suggests the presence of metastatic   disease.        Verified By: Osa Craver, M.D., MD   Assessment and Plan: Impression:   Stage IV lung cancer now with brain metastasis. Plan:   1.  Lung cancer/brain metastasis: After lengthy discussion with patient's daughter and primary caretaker, she thinks his best option is the hospice home.  She states she can no longer take care of him.  Patient may also benefit from palliative whole brain XRT and radiation oncology has been consult.  Continue Decadron as ordered. consult, will follow.  Electronic Signatures: Delight Hoh (MD)  (Signed 13-Oct-14 16:46)  Authored: HISTORY OF PRESENT ILLNESS, PFSH, ROS, NURSING NOTES, PE, ALLERGIES, HOME MEDICATIONS, LABS, OTHER RESULTS, ASSESSMENT AND PLAN   Last Updated: 13-Oct-14 16:46 by Delight Hoh (MD)

## 2014-10-15 NOTE — Consult Note (Signed)
History of Present Illness:   Reason for Consult Carcinoma of lung stage IIIB disease status post radiation chemotherapy  COPD with continuing tobacco abuse    HPI   . 69 year old gentleman with locally advanced carcinoma of lung. 20, 2013has started 2nd course of split radiation therapy. P ain is greatly improved since last visit, changes made include adding Fentanyl patch and changing to oxycodone PO. Appetite improved with prednisone. Family states that he is not very active but feels better than last week.  PFSH:   Comments No family history of colorectal cancer, breast cancer, or ovarian cancer.   Sr. had lung cancer    Comments Patient is a chronic smoker 2 packs per day for 60 years.  Also drinks alcohol cough.  1/2 gallon of hard Liquor daily    Additional Past Medical and Surgical History Hypertension Gastroesophageal reflux disease. Arthritis. Kidney stones. COPD. Past surgical history cataract surgery.   Review of Systems:   General weakness    Performance Status (ECOG) 1    Lungs cough  SOB    Review of Systems   Having increasing pain as described in history of present illness  Physical Exam:   HEENT: normal    Cardiac: regular rate, rhythm    Abdomen: soft  nontender    Skin: intact    Extremities: No edema, rash or cyanosis    Neuro: AAOx3    Psych: normal appearance  alert and cooperative     Lung Cancer:    HTN:    COPD:    MI - Myocardial Infarct:    Alcohol Abuse:    Codeine: Other    predniSONE 20 mg tablet: 1 tab(s) orally once a day x 30 days, Active, 30, None   meloxicam 15 mg tablet: 1 tab(s) orally once a day x 30 days, Active, 30, None   oxyCODONE 10 mg tablet: 1 tab(s) orally every 6 hours x 10 days, As Needed- for Chest Pain , Active, 40, None   Duragesic-50 50 mcg/hr film, extended release: 1 PATCH transdermal every 72 hours x 30 days, Active, 10, None   Vicodin 5 mg-500 mg oral tablet: 1 to 2 tab(s) orally every 4  hours, #20 tabs, NR,for Pain, Active, 0, None   dexamethasone 4 mg oral tablet: 2 tab(s) orally the night before each chemotherapy tx.Marland KitchenMarland Kitchen#20, NR, Active, 0, None   Magic Mouth Wash: 5 to 10 milliliter(s) orally 4 times a day, As Needed for oral discomfort, #240, swish and spit..., Active, 0, None   FA-8 0.8 mg tablet: 1 tab(s) orally once a day x 100 days, Active, 100, None   ondansetron 4 mg tablet: ,as needed  for nausea, Active, 30, None   hydrochlorothiazide 12.5 mg oral tablet: 1 tab(s) orally once a day, Active, 0, None   omeprazole 40 mg oral delayed release capsule: 1 cap(s) orally once a day, Active, 0, None   tramadol 50 mg oral tablet: Active, 0, None   buPROPion 150 mg/12 hours (SR) oral tablet, extended release: Active, 0, None   Chantix 1 mg oral tablet: Active, 0, None   Megace 40 mg/mL oral suspension: 20 milliliter(s) orally once a day as needed, Active, 0, None   potassium chloride 20 mEq oral tablet, extended release: 1 tab(s) orally once a day, Active, 0, None    Routine Hem:  20-Mar-13 08:54    WBC (CBC) 4.1   RBC (CBC) 3.46   Hemoglobin (CBC) 12.6   Hematocrit (CBC) 36.0   Platelet  Count (CBC) 343   MCV 104   MCH 36.3   MCHC 34.8   RDW 15.9   Neutrophil % 60.4   Lymphocyte % 25.0   Monocyte % 12.3   Eosinophil % 1.3   Basophil % 1.0   Neutrophil # 2.5   Lymphocyte # 1.0   Monocyte # 0.5   Eosinophil # 0.1   Basophil # 0.0  Routine Chem:  20-Mar-13 08:54    Glucose, Serum 103   BUN 11   Creatinine (comp) 0.71   Sodium, Serum 136   Potassium, Serum 3.7   Chloride, Serum 97   CO2, Serum 30   Calcium (Total), Serum 8.9  Hepatic:  20-Mar-13 08:54    Bilirubin, Total 0.3   Alkaline Phosphatase 114   SGPT (ALT) 15   SGOT (AST) 21   Total Protein, Serum 7.4   Albumin, Serum 3.4  Routine Chem:  20-Mar-13 08:54    Osmolality (calc) 272   eGFR (African American) >60   eGFR (Non-African American) >60   Anion Gap 9     Palonosetron  injection, ( Aloxi injection )  Cycle: 2 ; Day: 1  0.25 mg, IV push, once, 17-Jul-2011, Hold, Hold   DiphenhydrAMINE injection, ( Benadryl injection )  25 mg, IV push, once  Indication: Allergy Reaction/ Mild Sedation/ Motion Sickness Prevention/ Cough/ Nausea/ Dystonic Reaction, 10-Sep-2011, Hold, Hold   Palonosetron injection, ( Aloxi injection )  Cycle: 1 ; Day: 1  0.25 mg, IV push, once, 10-Sep-2011, Hold, Hold   Ranitidine injection, ( ZanTAC injection )  50 mg, IV Piggyback, once, Infuse over 15 minute(s)  Indication: Hyperacidity, 10-Sep-2011, Hold, Hold   CARBOplatin injection, ( Paraplatin injection )  Cycle: 1 ; Day: 1  200 mg in Sodium Chloride 0.9% 250 ml, IV Piggyback, once, Infuse over 30 minute(s)  -Indication:Antineoplastic, 10-Sep-2011, Hold, Hold   PACLitaxel injection, ( TaxOL injection )  Cycle: 1 ; Day: 1  72 mg in Sodium Chloride 0.9% inj - (Excel) 250 ml, IV Piggyback, once, Infuse over 60 minute(s)  -Indication:Antineoplastic, Low sorbing tubing with in line filter ??? drug in line, 10-Sep-2011, Hold, Hold  Assessment and Plan:  Impression:   Bronchial washing was positive for adenocarcinoma  carcinoma of lung (pathology from [facility].  Other classification was not done) molecular studies are not available.    T4 (as. described 2 nodules, Same lung 2 different lobes.  Only chest x-ray available for my review) N0 (staging mediastinoscopy.  with multiple lymph node biopsies are negative ) M0 based on PET scan and CT scan of head   with Oxycodone and Fentanyl patch for pain as this has improved. has improved appetitie as well as weakness. with Carbo/ Taxol chemotherapy today. course of radiation has been initiated. continue chemotherapy with carboplatinum and Taxol  Electronic Signatures: Jobe Gibbon (MD)  (Signed 20-Mar-13 09:47)  Authored: HISTORY OF PRESENT ILLNESS, PFSH, ROS, PE, PAST MEDICAL HISTORY, ALLERGIES, HOME MEDICATIONS, LABS,  ORDERS, ASSESSMENT AND PLAN   Last Updated: 20-Mar-13 09:47 by Jobe Gibbon (MD)

## 2014-10-15 NOTE — Discharge Summary (Signed)
PATIENT NAME:  Keith Chambers, Keith Chambers MR#:  [id number] DATE OF BIRTH:  07/31/1945  DATE OF ADMISSION:  09/29/2011 DATE OF DISCHARGE:  10/03/2011  DISCHARGE DIAGNOSES:  1. Right upper extremity and facial weakness secondary to cerebrovascular accident. (Territory of left middle cerebral artery.)  2. Failure to thrive secondary to carcinoma of lung, locally advanced stage IIIB disease.  3. Chronic obstructive pulmonary disease.  4. Chronic tobacco abuse.  5. On radiation and chemotherapy.   HISTORY OF PRESENT ILLNESS: Mr. Keith Chambers is a 69 year old gentleman who has a history of carcinoma of lung, stage IIIB disease, on radiation and chemotherapy started having increasing weakness in the right upper extremity, confusion off and on, fascial droop. The patient had multiple MRI scans prior to admission. The patient was admitted in the hospital with the diagnosis of right-sided cerebrovascular accident.   For detailed past history, social and family history, and examination, please refer to dictated note on the chart.   LABORATORY, DIAGNOSTIC AND RADIOLOGICAL DATA:  On April 5th, white count was 2.5, hemoglobin was 8.6.  Echocardiogram revealed global dysfunction hypokinesia. No evidence of any emboli.  MRI scan of brain with and without contrast with finding likely reflecting area of acute/subacute small vessel white matter ischemic changes.  Bilateral carotid Doppler study revealed moderate calcific plaque formation without any significant hemodynamic obstruction.   HOSPITAL COURSE: The patient was admitted in the hospital. Consultation from a neurologist was obtained. Physiotherapy and Occupational Therapy consultation was obtained. The patient's condition got stabilized. He was started on a small dose of aspirin. Radiation was continued. The patient's general condition got stabilized. At this time, the patient is now being transferred to rehab for further physiotherapy and occupational therapy. Re-evaluation  of lung cancer in the next 4 to 6 weeks. Overall prognosis is guarded because of underlying disease and multiple medical problems. The patient also had malnutrition, moderate, secondary to underlying malignancy and anorexia.   DISCHARGE MEDICATIONS:  1. Wellbutrin XR 150 mg every 12 hours.  2. Fentanyl patch 25 mcg every three days. 3. Meloxicam 15 mg p.o. daily.  4. Oxycodone 5 mg, 1 to 2 tablets every 4 to 6 hours p.r.n. for pain.  5. Potassium K-Dur 20 mEq p.o. daily.  6. Zantac 150 mg p.o. b.i.d.  7. Zoloft 50 mg p.o. daily.   NOTE: We will hold all antihypertensive medication.   ACTIVITY: Continue occupational therapy and physiotherapy.   FOLLOWUP: Return appointment to see me in four weeks with CBC and comprehensive metabolic panel.  ____________________________ Gerome SamJanak K. Aune Adami, MD jkc:cbb D: 10/03/2011 09:47:24 ET T: 10/03/2011 10:03:49 ET JOB#: [id number]  cc: Valarie ConesJanak K. Doylene Canninghoksi, MD, <Dictator> Laddie AquasJANAK K Zakya Halabi MD ELECTRONICALLY SIGNED 10/03/2011 15:24

## 2014-10-15 NOTE — Discharge Summary (Signed)
PATIENT NAME:  Keith Chambers, [name]:  [id number] DATE OF BIRTH:  07/31/1945  DATE OF ADMISSION:  09/24/2011 DATE OF DISCHARGE:  09/26/2011  FINAL DIAGNOSES:  1. Confusion and disorientation most likely secondary to dehydration. Possibility of cerebrovascular event cannot be ruled out.  2. Dehydration.  3. Diffuse bony pain.  4. Carcinoma of lung, non-small cell type, locally advanced disease.  5. Severe chronic obstructive pulmonary disease.  6. Poor social situation.   DISCHARGE MEDICATIONS:  1. Duragesic 50 mcg every 72 hours.  2. Prednisone 20 mg p.o. daily.  3. Meloxicam 15 mg 1 tablet daily.  4. Oxycodone 10 mg 1.5 tablets every 4 to 6 hours p.r.n. for breakthrough pain.  5. Potassium chloride 20 mEq p.o. daily. 6. Chantix 1 mg daily.   DIET: Regular.   ACTIVITY: As tolerated.   OVERALL PROGNOSIS: Poor.   HISTORY OF PRESENT ILLNESS: Mr. Keith Chambers is a known patient to me with carcinoma of lung. He was admitted in the hospital because of increasing confusion and disorientation, not able to drink or eat anything. The patient's condition has been declining over a period of time. He was brought in my office complaining of diffuse bony pain. He also was confused and disoriented. The patient was admitted to the hospital with dehydration. For detailed past history, social history, and family history, please refer to dictated note on the chart.   HOSPITAL COURSE: During the hospital stay, the patient was started on IV steroid, bronchodilator treatment. MRI scan of the brain was done which revealed areas likely representing regions of acute and subacute small vessel disease. There was no evidence of metastatic disease.   AVAILABLE LAB DATA: Glucose 88, sodium 140, potassium 3.1.   The patient's condition somewhat improved. After detailed discussion with the family, the patient was discharged. Chemotherapy was put on hold. Palliative radiation may be continued. Overall prognosis was guarded.  Home health agency was sent for physical therapy and monitoring of medications.     ____________________________ Gerome SamJanak K. Jamyia Fortune, MD jkc:ap D: 10/06/2011 08:23:59 ET T: 10/07/2011 10:45:27 ET JOB#: [id number]  cc: Valarie ConesJanak K. Doylene Canninghoksi, MD, <Dictator> Laddie AquasJANAK K Ludene Stokke MD ELECTRONICALLY SIGNED 10/08/2011 11:30

## 2014-10-15 NOTE — Consult Note (Signed)
PATIENT NAME:  Keith Chambers, Keith Chambers MR#:  [id number] DATE OF BIRTH:  07/31/1945  DATE OF CONSULTATION:  09/30/2011  REFERRING PHYSICIAN:  Dr. Doylene Canninghoksi  CONSULTING PHYSICIAN:  Rose PhiPeter R. Kemper Durielarke, MD  HISTORY: Mr. Keith Chambers is a 69 year old right-handed married white former sheet rock worker, patient of Dr. Doylene Canninghoksi with history of adenocarcinoma of the lung diagnosed in 03/25/2011 treated with chemotherapy and radiation therapy, and history of chronic tobacco and ethanol abuse, hypertension, chronic obstructive pulmonary disease, acid reflux, arthritis, and kidney stones. [facility] was [facility] with apparent disorientation and confusion and mild right upper extremity weakness with MRI scan of the brain showing small areas of altered signal intensity on diffusion weighted imaging consistent with areas of nonhemorrhagic infarction of a branch of the left middle cerebral artery of the frontoparietal area including the cortex. He is now admitted 09/29/2011 and is referred for evaluation of right side weakness. History comes from the patient, conversation with Dr. Doylene Canninghoksi, and his hospital records.   [facility]2011 and he had been noted to be more weak in the upper extremity and somewhat generally weak with question of failure to thrive. Patient endorses continued problems since approximately 04/03 with weakness of the right arm and hand, slurred speech, and difficulty getting out words that he wanted to say. He denies trouble swallowing or chewing. He reports 8 or 10 years ago he had a one day episode of similar problems with weakness of the right arm and hand. He does not recall today whether he had trouble talking with that episode. He endorses weight loss and feeling generally weak the past several months. Brain MRI scan the evening of 09/29/2011 was similar to scan of 09/25/2011 with areas of altered signal intensity and a region consistent with a branch of the  left middle cerebral artery at the frontoparietal junction region. Doppler study of the carotid and vertebral arteries shows no evidence of surgical disease.   PHYSICAL EXAMINATION: The patient is a very thin, elderly appearing white gentleman who was examined lying semisupine, no apparent distress with blood pressure 115/75 and heart rate 80. There is no fever. He was normocephalic without evidence of trauma. His neck was supple. He was alert, had mildly slurred speech and had moderate difficulty with expression. His affect was normal. There was mild upper motor neuron right facial weakness on cranial nerve examination which was also notable for subjective decreased light touch sensation right side of the face. Eye movements were normal and visual fields were full to finger count for each eye; visual acuity was not tested. On motor examination of the extremities, there was diffuse increase of muscle bulk. There was relative superimposed atrophy of bilateral ulnar innervated hand muscles. Strength throughout extremity was rated overall 4-/5 proximally and distally. Left upper extremity, strength was intact with the exception of 4-/5 for the ulnar innervated hand muscles. Strength of bilateral lower extremities was 5/5 proximally and distally. Coordination examination was consistent with degree of right extremity weakness. On sensory examination, he reported relative decrease of light touch perception of the right hand and arm compared to normal sensation of left and normal light touch of the legs. Vibration perception was trace of the feet and toes bilaterally. Reflexes were bilaterally 1 to 2+ in extremities and knees and were trace at the ankles. His gait was not tested.   IMPRESSION:  1. Right upper extremity more than face weakness, decreased sensation of the right face, upper  extremity and moderate expressive aphasia associated with recent nonhemorrhagic areas of stroke in territory of brain left middle  cerebral artery area.  2. Weakness of ulnar innervated hand muscles bilaterally, likely associated with history of chronic ethanol use.  3. Progressive weight loss over the past many months with feeling of generalized weakness and failure to thrive.   RECOMMENDATIONS:  1. One small aspirin per day.  2. Will be scheduled for echocardiogram to rule out embolic disorder.   3. I agree with indication for evaluation by physical therapy. Will also be seen by occupational therapy and speech and language pathology.   I appreciate being asked to see this pleasant and interesting gentleman.   ____________________________ Rose Phi. Kemper Durie, MD prc:cms D: 09/30/2011 10:57:24 ET T: 09/30/2011 12:02:25 ET JOB#: [id number]  cc: Rose Phi. Kemper Durie, MD, <Dictator> Keith Garbe MD ELECTRONICALLY SIGNED 10/15/2011 11:14

## 2014-10-15 NOTE — Consult Note (Signed)
Reason for Visit: This 69 year old Male patient presents to the clinic for initial evaluation of  Lung cancer .   Referred by Dr. Doylene Canning.  Diagnosis:   Chief Complaint/Diagnosis 69 year old male with right lung adenocarcinoma stage IIIB (T3, N3, M0) status post initial chemotherapy with moderate response    Pathology Report Pathology report reviewed    Imaging Report Serial PET/CT scan is reviewed    Referral Report Clinical notes reviewed    Planned Treatment Regimen Concurrent chemotherapy and radiation therapy with curative intent    HPI Patient is a 69 year old male chronic smoker who originally was evaluated at [facility] for right upper lobe mass 5.2 x 6 cm invading the right mainstem bronchus. He is asked me showed multiple stations containing metastatic adenopathy. He also by PET/CT has a contralateral N3 lymph node. Patient was referred back to Dr. Doylene Canning for preoperative chemotherapy and undergone 3 cycles of carboplatinum and Alimta with a moderate response. Patient wishes to maintain his care here was seen by Dr. Inez Catalina who is recommended radiation therapy with concurrent chemotherapy with curative intent. Based on the patient's poor performance status as well as lung capacity as well as N3 nodes believe surgery is not an option. I been asked to evaluate the patient for possible radiation therapy at this time. Patient is somewhat short breast short of breath. He is having some right leg pain which has been chronic problem he is otherwise without complaints. By mouth intake has been poor he has had some weight loss.   Past Hx:    Lung Cancer:    HTN:    COPD:    MI - Myocardial Infarct:    Alcohol Abuse:   Past, Family and Social History:   Past Medical History positive    Cardiovascular coronary artery disease; hypertension; myocardial infarction    Respiratory COPD    Genitourinary kidney stones    Past Surgical History Cataract surgery    Past Medical History  Comments Arthritis    Family History positive    Family History Comments Father with lung cancer    Social History positive    Social History Comments Greater than 60-pack-year smoking history, also history of EtOH abuse    Additional Past Medical and Surgical History Accompanied by multiple family members today   Allergies:   Codeine: Other  Home Meds:  Home Medications:  Vicodin 500 mg-5 mg tablet: 1-2 tab(s) orally every 4 hours x 10 days , Active  Magic Mouth Wash: 5 to 10 milliliter(s) orally 4 times a day, As Needed for oral discomfort, #240, swish and spit..., Active  FA-8 0.8 mg tablet: 1 tab(s) orally once a day x 100 days, Active  ondansetron 4 mg tablet: ,as needed  for nausea, Active  dexamethasone 4 mg tablet: 1 tab(s) orally 2 times a day x   A.day  before r and day of and day after each chemotherapy, Active  hydrochlorothiazide 12.5 mg oral tablet: 1 tab(s) orally once a day, Active  omeprazole 40 mg oral delayed release capsule: 1 cap(s) orally once a day, Active  potassium chloride 20 mEq oral tablet, extended release: 1 tab(s) orally 2 times a day, Active  tramadol 50 mg oral tablet: Active  buPROPion 150 mg/12 hours (SR) oral tablet, extended release: Active  Chantix 1 mg oral tablet: Active  Megace 40 mg/mL oral suspension: 20 milliliter(s) orally once a day, Active  Review of Systems:   General negative    Performance Status (ECOG) 0  Skin negative    Breast see HPI    Ophthalmologic see HPI    ENMT negative    Respiratory and Thorax see HPI    Cardiovascular see HPI    Gastrointestinal negative    Genitourinary negative    Musculoskeletal negative    Neurological negative    Psychiatric negative    Hematology/Lymphatics negative    Endocrine negative    Allergic/Immunologic negative   Nursing Notes:  Nursing Vital Signs and Chemo Nursing Nursing Notes: *CC Vital Signs Flowsheet:   07-Jan-13 13:54   Temp Temperature 98.9    Pulse Pulse 92   Respirations Respirations 20   SBP SBP 123   DBP DBP 77   Pain Scale (0-10)  0   Current Weight (kg) (kg) 52.3   Height (cm) centimeters 166.5   BSA (m2) 1.5   Physical Exam:  General/Skin/HEENT:   General normal    Skin normal    Eyes normal    ENMT normal    Head and Neck normal    Additional PE Well-developed thin male in NAD. He appears older than stated age. Lungs are clear to A&P cardiac examination shows regular rate and rhythm. Abdomen is benign.   Breasts/Resp/CV/GI/GU:   Respiratory and Thorax normal    Cardiovascular normal    Gastrointestinal normal    Genitourinary normal   MS/Neuro/Psych/Lymph:   Musculoskeletal normal    Neurological normal    Lymphatics normal   Other Results:  Radiology Results: CT:    13-Jul-11 21:14, CT Cervical Spine Without Contrast   CT Cervical Spine Without Contrast    REASON FOR EXAM:    s/p fall eval for fx  COMMENTS:       PROCEDURE: CT  - CT CERVICAL SPINE WO  - Jan 02 2010  9:14PM     RESULT:     TECHNIQUE: Multiplanar imaging of the cervical spine is obtained   utilizing helical 2 mm acquisition and bone reconstruction algorithm.    FINDINGS: Evaluation of the cervical spine demonstrates no evidence of   acute fracture, dislocation or malalignment. There is no evidence of   prevertebral soft tissue swelling. Multilevel degenerative changes are   appreciated.    IMPRESSION:      1.  No evidence of acute osseous abnormalities.  2.  Multilevel spondylolysis.  3.  Dr. Sharma CovertNorman of the Emergency Department was informed of these findings   via preliminary faxed report dated 01/02/2010 at 8:22 PM, Central Time.      Thank you for this opportunity to contribute to the care of your patient.           Verified By: Jani FilesHECTOR W. COOPER, M.D., MD  Nuclear Med:    02-Jan-13 14:21, PET/CT Scan Lung Cancer Restaging   PET/CT Scan Lung Cancer Restaging    REASON FOR EXAM:    Lung CA Restaging Non  Small Cell  COMMENTS:       PROCEDURE: PET - PET/CT RESTAGING LUNG CA  - Jun 25 2011  2:21PM     RESULT:     A total body PET was performed in conjunction with a nonattenuated CT   status post right antecubital injection of 12.63 mCi of F-18 labeled   fluorodeoxyglucose. The injection time was at 12:44 p.m. with a scan   starts at 1:51 p.m. and a scan end time at 2:07 p.m.    The patient's fasting blood glucose was measured at 89 mg/dL.    Findings: Appropriate  biodistribution is identified in the region of the     skull base, heart, liver, spleen, kidneys and urinary bladder. An area of   intense hypermetabolic activity projects within the right upper lobe soft   tissue mass projecting along the distal aspect of the right mainstem   bronchus demonstrating a mean SUV of 5.59. There is compression or   possible invasion of the distal mainstem bronchus with area of tapering   narrowing. Areas of nodal activity project within the right hilar region,   within the left paratracheal region and within the left hilar region. The   areas in the right hilar and peritracheal region demonstrate SUV values   ranging from 3.53. The regions in the left hilar area are below the   threshold of concern with an SUV of 1.5. A nonspecific focus of moderate   hypermetabolic activity projects along the posterior aspect of the psoas   muscle on the left. This likely represent vascular uptake. No definite   mass or nodule is identified. There only appears to be vessels projecting   in this region.    IMPRESSION:      1. Findings consistent with FDG avid disease in the mass within the right   upper lobe perihilar region as well as mediastinal adenopathy.  2. Nonspecific findings are identified within the left hilar region as   well as within the posterior aspect of the psoas on the left.    Thank you for the opportunity to contribute to the care of your patient.           Verified By: Jani Files, M.D., MD   Assessment and Plan:   Impression Clinical stage IIIB adenocarcinoma of the right lung status post induction chemotherapy for possibility of resection in 69 year old male with poor performance status and unresectable disease    Plan At the stomach ahead with radiation therapy to his right lung and mediastinum. Would plan on delivering 4000 cGy over 4 weeks and evaluate for response and tumor response. Risks and benefits of treatment including dysphasia weakness and skin changes were reviewed with the patient and his family. I will treat his lungs according to [facility] guidelines. Favor split course treatment based on the patient's overall poor general condition. She tolerated her shows treatments well and we see tumor response with offered another 3000 cGy in 3 weeks to a small field lung boost.  I have set him up for CT simulation later this week. Have also discussed the case personally with Dr. Doylene Canning who will be offering concurrent chemotherapy for radiation sensitizing.  I would like to take this opportunity to thank you for allowing me to continue to participate in this patient's care.   CC Referral:   cc: Dr. Rolm Gala   Electronic Signatures: Rushie Chestnut Gordy Councilman (MD)  (Signed 07-Jan-13 15:25)  Authored: HPI, Diagnosis, Past Hx, PFSH, Allergies, Home Meds, ROS, Nursing Notes, Physical Exam, Other Results, Encounter Assessment and Plan, CC Referring Physician   Last Updated: 07-Jan-13 15:25 by Rebeca Alert (MD)
